# Patient Record
Sex: Female | Born: 1990 | Hispanic: Yes | Marital: Married | Smoking: Former smoker
Health system: Southern US, Community
[De-identification: ages and names within clinical notes are randomized; demographics above are authoritative.]

## PROBLEM LIST (undated history)

## (undated) DIAGNOSIS — Z8759 Personal history of other complications of pregnancy, childbirth and the puerperium: Secondary | ICD-10-CM

## (undated) DIAGNOSIS — D649 Anemia, unspecified: Secondary | ICD-10-CM

## (undated) DIAGNOSIS — F329 Major depressive disorder, single episode, unspecified: Secondary | ICD-10-CM

## (undated) DIAGNOSIS — Z8709 Personal history of other diseases of the respiratory system: Secondary | ICD-10-CM

## (undated) DIAGNOSIS — L309 Dermatitis, unspecified: Secondary | ICD-10-CM

## (undated) DIAGNOSIS — Z8489 Family history of other specified conditions: Secondary | ICD-10-CM

## (undated) DIAGNOSIS — F32A Depression, unspecified: Secondary | ICD-10-CM

## (undated) HISTORY — DX: Depression, unspecified: F32.A

## (undated) HISTORY — PX: TONSILLECTOMY: SUR1361

## (undated) HISTORY — PX: DILATION AND CURETTAGE OF UTERUS: SHX78

## (undated) HISTORY — DX: Personal history of other complications of pregnancy, childbirth and the puerperium: Z87.59

## (undated) HISTORY — DX: Personal history of other diseases of the respiratory system: Z87.09

## (undated) HISTORY — PX: TONSILLECTOMY AND ADENOIDECTOMY: SUR1326

## (undated) HISTORY — DX: Dermatitis, unspecified: L30.9

---
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
## 2009-02-07 ENCOUNTER — Ambulatory Visit: Payer: Self-pay | Admitting: Family Medicine

## 2009-06-13 ENCOUNTER — Ambulatory Visit: Payer: Self-pay | Admitting: Internal Medicine

## 2019-12-28 ENCOUNTER — Telehealth: Payer: Self-pay

## 2019-12-28 NOTE — Telephone Encounter (Signed)
Pt called report heavy bleeding not sure if shes having a miscarriage, I advised to go to the hospital, and we have her scheduled for an annual this Friday with [facility]

## 2019-12-31 ENCOUNTER — Encounter: Payer: Self-pay | Admitting: Obstetrics & Gynecology

## 2019-12-31 ENCOUNTER — Other Ambulatory Visit: Payer: Self-pay

## 2019-12-31 ENCOUNTER — Other Ambulatory Visit (HOSPITAL_COMMUNITY)
Admission: RE | Admit: 2019-12-31 | Discharge: 2019-12-31 | Disposition: A | Discharge status: Home / Self Care | Payer: Self-pay | Source: Ambulatory Visit | Attending: Obstetrics & Gynecology | Admitting: Obstetrics & Gynecology

## 2019-12-31 ENCOUNTER — Ambulatory Visit (INDEPENDENT_AMBULATORY_CARE_PROVIDER_SITE_OTHER): Payer: PRIVATE HEALTH INSURANCE | Admitting: Obstetrics & Gynecology

## 2019-12-31 VITALS — BP 120/80 | Ht 63.0 in | Wt 224.0 lb

## 2019-12-31 DIAGNOSIS — N920 Excessive and frequent menstruation with regular cycle: Secondary | ICD-10-CM

## 2019-12-31 DIAGNOSIS — Z124 Encounter for screening for malignant neoplasm of cervix: Secondary | ICD-10-CM | POA: Insufficient documentation

## 2019-12-31 DIAGNOSIS — Z01419 Encounter for gynecological examination (general) (routine) without abnormal findings: Secondary | ICD-10-CM | POA: Diagnosis not present

## 2019-12-31 DIAGNOSIS — Z6839 Body mass index (BMI) 39.0-39.9, adult: Secondary | ICD-10-CM

## 2019-12-31 DIAGNOSIS — N938 Other specified abnormal uterine and vaginal bleeding: Secondary | ICD-10-CM

## 2019-12-31 DIAGNOSIS — E669 Obesity, unspecified: Secondary | ICD-10-CM

## 2019-12-31 NOTE — Progress Notes (Addendum)
HPI:      [name] is a 29 y.o. G2P1011 who LMP was Patient's last menstrual period was 12/22/2019., she presents today for her annual examination. The patient has no complaints today other than recent menorrhagia as described below. The patient is sexually active. Her last pap: was normal. The patient does perform self breast exams.  There is no notable family history of breast or ovarian cancer in her family.  The patient has regular exercise: yes.  The patient denies current symptoms of depression.  Pt has a h/o rare periods, more so spotting certain months regularly, and an occas full period every 6-74mos.  She started her period last week and it has been heavier than usual and prolonged.  She was seen in ER w neg uCG, and neg vWd testing (has FH).  Placed on short OCP taper. (already was on her Junel of many years.)  Bleeding has since slowed down as of today.  Mild pain associated w menses.    GYN History: Contraception: OCP (estrogen/progesterone)  PMHx: Past Medical History:  Diagnosis Date  . Depression    Past Surgical History:  Procedure Laterality Date  . TONSILLECTOMY     Family History  Problem Relation Age of Onset  . Melanoma Mother 46   Social History   Tobacco Use  . Smoking status: Never Smoker  . Smokeless tobacco: Never Used  Substance Use Topics  . Alcohol use: Yes  . Drug use: Never    Current Outpatient Medications:  .  Biotin 10 MG CAPS, Take by mouth., Disp: , Rfl:  .  busPIRone (BUSPAR) 5 MG tablet, Take 5 mg by mouth 2 (two) times daily., Disp: , Rfl:  .  cyanocobalamin 1000 MCG tablet, Take by mouth., Disp: , Rfl:  .  escitalopram (LEXAPRO) 5 MG tablet, Take by mouth., Disp: , Rfl:  .  fluocinonide cream (LIDEX) 0.05 %, Apply 1 application topically 2 (two) times daily., Disp: , Rfl:  .  ketoconazole (NIZORAL) 2 % shampoo, Apply 1-3 times a week to scalp. Let sit 10 min then rinse out., Disp: , Rfl:  .  Norethindrone Acetate-Ethinyl  Estradiol (LOESTRIN) 1.5-30 MG-MCG tablet, TAKE 1 TABLET BY MOUTH EVERY DAY FOR 21 DAYS, NO PILLS FOR 7 DAYS THEN START NEW PACK AS DIRECTED, Disp: , Rfl:  Allergies: Patient has no allergy information on record.  Review of Systems  Constitutional: Positive for malaise/fatigue. Negative for chills and fever.  HENT: Negative for congestion, sinus pain and sore throat.   Eyes: Negative for blurred vision and pain.  Respiratory: Negative for cough and wheezing.   Cardiovascular: Negative for chest pain and leg swelling.  Gastrointestinal: Positive for abdominal pain. Negative for constipation, diarrhea, heartburn, nausea and vomiting.  Genitourinary: Negative for dysuria, frequency, hematuria and urgency.  Musculoskeletal: Negative for back pain, joint pain, myalgias and neck pain.  Skin: Negative for itching and rash.  Neurological: Negative for dizziness, tremors and weakness.  Endo/Heme/Allergies: Does not bruise/bleed easily.  Psychiatric/Behavioral: Positive for depression. The patient is not nervous/anxious and does not have insomnia.   All other systems reviewed and are negative.   Objective: BP 120/80   Ht 5\' 3"  (1.6 m)   Wt 224 lb (101.6 kg)   LMP 12/22/2019   BMI 39.68 kg/m   Filed Weights   12/31/19 1034  Weight: 224 lb (101.6 kg)   Body mass index is 39.68 kg/m. Physical Exam Constitutional:      General: She is not  in acute distress.    Appearance: She is well-developed. She is obese.  Genitourinary:     Pelvic exam was performed with patient supine.     Vagina, uterus and rectum normal.     No lesions in the vagina.     No vaginal bleeding.     No cervical motion tenderness, friability, lesion or polyp.     Uterus is mobile.     Uterus is not enlarged.     No uterine mass detected.    Uterus is midaxial.     No right or left adnexal mass present.     Right adnexa not tender.     Left adnexa not tender.  HENT:     Head: Normocephalic and atraumatic. No  laceration.     Right Ear: Hearing normal.     Left Ear: Hearing normal.     Mouth/Throat:     Pharynx: Uvula midline.  Eyes:     Pupils: Pupils are equal, round, and reactive to light.  Neck:     Thyroid: No thyromegaly.  Cardiovascular:     Rate and Rhythm: Normal rate and regular rhythm.     Heart sounds: No murmur. No friction rub. No gallop.   Pulmonary:     Effort: Pulmonary effort is normal. No respiratory distress.     Breath sounds: Normal breath sounds. No wheezing.  Chest:     Breasts:        Right: No mass, skin change or tenderness.        Left: No mass, skin change or tenderness.  Abdominal:     General: Bowel sounds are normal. There is no distension.     Palpations: Abdomen is soft.     Tenderness: There is no abdominal tenderness. There is no rebound.  Musculoskeletal:        General: Normal range of motion.     Cervical back: Normal range of motion and neck supple.  Neurological:     Mental Status: She is alert and oriented to person, place, and time.     Cranial Nerves: No cranial nerve deficit.  Skin:    General: Skin is warm and dry.  Psychiatric:        Judgment: Judgment normal.  Vitals reviewed.     Assessment:  ANNUAL EXAM 1. Women's annual routine gynecological examination   2. Menorrhagia with regular cycle   3. Screening for cervical cancer      Screening Plan:            1.  Cervical Screening-  Pap smear done today  2. Breast screening- Exam annually and mammogram>40 planned   3. Colonoscopy every 10 years, Hemoccult testing - after age 53  4. Labs managed by PCP  5. Counseling for contraception: oral contraceptives (estrogen/progesterone)   6. Menorrhagia with regular cycle Patient has abnormal uterine bleeding . She has a normal exam today, with no evidence of lesions.  Evaluation includes the following: exam, labs such as hormonal testing, and pelvic ultrasound to evaluate for any structural gynecologic abnormalities.  Patient  to follow up after testing.  Treatment option for menorrhagia or menometrorrhagia discussed in great detail with the patient.  Options include hormonal therapy, IUD therapy such as Mirena, D&C, Ablation, and Hysterectomy.  The pros and cons of each option discussed with patient.  - US PELVIC COMPLETE WITH TRANSVAGINAL; Future  An additional total of 20 minutes were spent face-to-face with the patient as well as preparation, review, communication,  and documentation during this encounter.     F/U  Return in about 1 week (around 01/07/2020) for Follow up w GYN [place].  Barnett Applebaum, MD, [facility], [facility] 12/31/2019  11:12 AM

## 2019-12-31 NOTE — Patient Instructions (Signed)
Transvaginal Ultrasound A transvaginal ultrasound, also called an endovaginal ultrasound, is a test that uses sound waves to take pictures of the female genital tract. The pictures are taken with a device, called a transducer, that is placed in the vagina. This test may be done to:  Check for problems with your pregnancy.  Check your developing baby.  Check for anything abnormal in the uterus or ovaries.  Find out why you have pelvic pain or bleeding. Tell a health care provider about:  Any allergies you have.  All medicines you are taking, including vitamins, herbs, eye drops, creams, and over-the-counter medicines.  Any blood disorders you have.  Any surgeries you have had.  Any medical conditions you have.  Whether you are pregnant or may be pregnant.  Whether you are having your menstrual period. What are the risks? This is a safe procedure. There are no known risks or complications of having this test. What happens before the procedure? This procedure needs to be done when your bladder is empty. Follow your health care provider's instructions about drinking fluids and emptying your bladder before the test. What happens during the procedure?   You will empty your bladder before the procedure.  You will undress from the waist down.  You will lie down on an exam table, with your knees bent and your feet in foot holders.  A health care provider will cover the transducer with a sterile cover.  A gel will be put on the transducer. The gel helps transmit the sound waves and prevents irritation of your vagina.  The technician will insert the transducer into your vagina to get images. These will be displayed on a monitor that looks like a small television screen.  The transducer will be removed when the procedure is complete. The procedure may vary among health care providers and hospitals. What happens after the procedure?  It is up to you to get the results of your  procedure. Ask your health care provider, or the department that is doing the procedure, when your results will be ready.  Keep all follow-up visits as told by your health care provider. This is important. Summary  A transvaginal ultrasound, also called an endovaginal ultrasound, is a test that uses sound waves to take pictures of the female genital tract.  This is a safe procedure. There are no known risks associated with this test.  The procedure needs to be done when your bladder is empty. Follow your health care provider's instructions about drinking fluids and emptying your bladder before the test.  During the procedure, you will undress from the waist down and lie down on an exam table. A technician will insert a transducer into your vagina to obtain images.  Ask your health care provider, or the department that is doing the procedure, when your results will be ready. This information is not intended to replace advice given to you by your health care provider. Make sure you discuss any questions you have with your health care provider. Document Revised: 05/20/2018 Document Reviewed: 05/20/2018 Elsevier Patient Education  2020 Elsevier Inc.  

## 2020-01-03 LAB — CYTOLOGY - PAP: Diagnosis: NEGATIVE

## 2020-01-10 ENCOUNTER — Ambulatory Visit (INDEPENDENT_AMBULATORY_CARE_PROVIDER_SITE_OTHER): Payer: PRIVATE HEALTH INSURANCE | Admitting: Obstetrics & Gynecology

## 2020-01-10 ENCOUNTER — Other Ambulatory Visit: Payer: Self-pay

## 2020-01-10 ENCOUNTER — Ambulatory Visit (INDEPENDENT_AMBULATORY_CARE_PROVIDER_SITE_OTHER): Payer: PRIVATE HEALTH INSURANCE

## 2020-01-10 ENCOUNTER — Other Ambulatory Visit: Payer: Self-pay | Admitting: Obstetrics & Gynecology

## 2020-01-10 ENCOUNTER — Encounter: Payer: Self-pay | Admitting: Obstetrics & Gynecology

## 2020-01-10 VITALS — BP 120/80 | Ht 63.0 in | Wt 228.0 lb

## 2020-01-10 DIAGNOSIS — N920 Excessive and frequent menstruation with regular cycle: Secondary | ICD-10-CM | POA: Diagnosis not present

## 2020-01-10 DIAGNOSIS — N921 Excessive and frequent menstruation with irregular cycle: Secondary | ICD-10-CM | POA: Diagnosis not present

## 2020-01-10 NOTE — Progress Notes (Signed)
  HPI: Pt has had prolonged bleeding for this month, on OCPs for three years without prior incident.    Ultrasound demonstrates no masses seen These findings are Pelvis normal  PMHx: She  has a past medical history of Depression. Also,  has a past surgical history that includes Tonsillectomy., family history includes Melanoma (age of onset: 11) in her mother.,  reports that she has never smoked. She has never used smokeless tobacco. She reports current alcohol use. She reports that she does not use drugs.  She has a current medication list which includes the following prescription(s): biotin, buspirone, cyanocobalamin, escitalopram, fluocinonide cream, ketoconazole, and norethindrone acetate-ethinyl estradiol. Also, has no allergies on file.  Review of Systems  All other systems reviewed and are negative.   Objective: BP 120/80   Ht 5\' 3"  (1.6 m)   Wt 228 lb (103.4 kg)   LMP 12/22/2019   BMI 40.39 kg/m   Physical examination Constitutional NAD, Conversant  Skin No rashes, lesions or ulceration.   Extremities: Moves all appropriately.  Normal ROM for age. No lymphadenopathy.  Neuro: Grossly intact  Psych: Oriented to PPT.  Normal mood. Normal affect.   02/21/2020 PELVIS TRANSVAGINAL NON-OB (TV ONLY)  Result Date: 01/10/2020 Patient Name: Meagan Graham DOB: 12/18/1990 MRN: 1991 ULTRASOUND REPORT Location: [facility] Date of Service: 01/10/2020 Indications:Abnormal Uterine Bleeding Findings: The uterus is anteverted and measures 7.8 x 4.9 x 3.9 cm. Echo texture is homogenous without evidence of focal masses. The Endometrium measures 4.4 mm. Right Ovary measures 2.9 x 3.0 x 2.6 cm. It is normal in appearance. Left Ovary measures 3.7 x 2.9 x 2.9 cm. It is normal in appearance. Survey of the adnexa demonstrates no adnexal masses. There is no free fluid in the cul de sac. Impression: 1. Normal pelvic ultrasound. Recommendations: 1.Clinical correlation with the patient's History and  Physical Exam. 01/12/2020, RT Review of ULTRASOUND.    I have personally reviewed images and report of recent ultrasound done at [facility].    Plan of management to be discussed with patient. [facility], MD, FACOG [facility], [facility] 01/10/2020  3:37 PM   Assessment:  Menometrorrhagia OCP new pack start now Alternatives for hormone control of cycles and bleeding discussed  A total of 20 minutes were spent face-to-face with the patient as well as preparation, review, communication, and documentation during this encounter.   01/12/2020, MD, [facility], [facility] 01/10/2020  3:49 PM

## 2020-01-12 ENCOUNTER — Telehealth: Payer: Self-pay

## 2020-01-12 NOTE — Telephone Encounter (Signed)
Pt called triage to let [facility] know the bleeding is back and is heavy as before started last night around 10pm, no pain or cramps  Just wanting to know what to do next. Pt aware RPH will be in the office tomorrow, I advised her to go to the ER if changing pad every hour.

## 2020-01-13 NOTE — Telephone Encounter (Signed)
Pt aware to start the pills,you are talking about her BC pills correct? Also wanted to ask if her being on her feet at work 12 hours a day would cause this bleeding. She also states she's had this job for 3 years, I told her I didn't think it had anything to do with her work, but would still ask you.

## 2020-01-13 NOTE — Telephone Encounter (Signed)
Advise her to take 3 pills daily for 3 days, then 2 pills daily for 3 days, then one pill daily until pack finished then start next pack immediately as directed.  Only other option at this point would be surgery if the bleeding does not improve.

## 2020-01-13 NOTE — Telephone Encounter (Signed)
Pt reports bleeding is a lot more, and from 8AM to 12:30 today she's changed her pad 8 time, She wanted to know of the pills would make it worse before it got better. Please advise

## 2020-01-14 ENCOUNTER — Telehealth: Payer: Self-pay | Admitting: Obstetrics & Gynecology

## 2020-01-14 ENCOUNTER — Other Ambulatory Visit: Payer: Self-pay

## 2020-01-14 ENCOUNTER — Encounter: Payer: Self-pay | Admitting: Obstetrics & Gynecology

## 2020-01-14 ENCOUNTER — Ambulatory Visit (INDEPENDENT_AMBULATORY_CARE_PROVIDER_SITE_OTHER): Payer: PRIVATE HEALTH INSURANCE | Admitting: Obstetrics & Gynecology

## 2020-01-14 VITALS — BP 100/70 | Ht 63.0 in | Wt 225.0 lb

## 2020-01-14 DIAGNOSIS — N938 Other specified abnormal uterine and vaginal bleeding: Secondary | ICD-10-CM | POA: Diagnosis not present

## 2020-01-14 DIAGNOSIS — N921 Excessive and frequent menstruation with irregular cycle: Secondary | ICD-10-CM

## 2020-01-14 NOTE — Patient Instructions (Addendum)
PRE ADMISSION TESTING For Covid, prior to procedure Tuesday 9:00-10:00 [facility] entrance (drive up)  Results in 88-91 hours You will not receive notification if test results are negative. If positive for Covid19, your provider will notify you by phone, with additional instructions.   Hysteroscopy, Care After This sheet gives you information about how to care for yourself after your procedure. Your health care provider may also give you more specific instructions. If you have problems or questions, contact your health care provider. What can I expect after the procedure? After the procedure, it is common to have:  Cramping.  Bleeding. This can vary from light spotting to menstrual-like bleeding. Follow these instructions at home: Activity  Rest for 1-2 days after the procedure.  Do not douche, use tampons, or have sex for 2 weeks after the procedure, or until your health care provider approves.  Do not drive for 24 hours after the procedure, or for as long as told by your health care provider.  Do not drive, use heavy machinery, or drink alcohol while taking prescription pain medicines. Medicines   Take over-the-counter and prescription medicines only as told by your health care provider.  Do not take aspirin during recovery. It can increase the risk of bleeding. General instructions  Do not take baths, swim, or use a hot tub until your health care provider approves. Take showers instead of baths for 2 weeks, or for as long as told by your health care provider.  To prevent or treat constipation while you are taking prescription pain medicine, your health care provider may recommend that you: ? Drink enough fluid to keep your urine clear or pale yellow. ? Take over-the-counter or prescription medicines. ? Eat foods that are high in fiber, such as fresh fruits and vegetables, whole grains, and beans. ? Limit foods that are high in fat and processed sugars, such as  fried and sweet foods.  Keep all follow-up visits as told by your health care provider. This is important. Contact a health care provider if:  You feel dizzy or lightheaded.  You feel nauseous.  You have abnormal vaginal discharge.  You have a rash.  You have pain that does not get better with medicine.  You have chills. Get help right away if:  You have bleeding that is heavier than a normal menstrual period.  You have a fever.  You have pain or cramps that get worse.  You develop new abdominal pain.  You faint.  You have pain in your shoulders.  You have shortness of breath. Summary  After the procedure, you may have cramping and some vaginal bleeding.  Do not douche, use tampons, or have sex for 2 weeks after the procedure, or until your health care provider approves.  Do not take baths, swim, or use a hot tub until your health care provider approves. Take showers instead of baths for 2 weeks, or for as long as told by your health care provider.  Report any unusual symptoms to your health care provider.  Keep all follow-up visits as told by your health care provider. This is important. This information is not intended to replace advice given to you by your health care provider. Make sure you discuss any questions you have with your health care provider. Document Revised: 09/19/2017 Document Reviewed: 11/05/2016 Elsevier Patient Education  2020 [employer].

## 2020-01-14 NOTE — Telephone Encounter (Signed)
-----   Message from Nadara Mustard, MD sent at 01/14/2020 11:28 AM EDT ----- Regarding: Surgery next week Surgery Booking Request Patient Full Name:  Meagan Graham  MRN: [id number]  DOB: [birth year]  Surgeon: Letitia Libra, MD  Requested Surgery Date and Time: April 1 Primary Diagnosis AND Code: Menometrorrhagia Secondary Diagnosis and Code: N92.1 Surgical Procedure: Hysteroscopy D&C L&D Notification: No Admission Status: same day surgery Length of Surgery: 20 min Special Case Needs: No H&P: No Phone Interview???:  Yes Interpreter: No Language:  Medical Clearance:  No Special Scheduling Instructions: no Any known health/anesthesia issues, diabetes, sleep apnea, latex allergy, defibrillator/pacemaker?: No Acuity: P1   (P1 highest, P2 delay may cause harm, P3 low, elective gyn, P4 lowest)

## 2020-01-14 NOTE — Telephone Encounter (Signed)
Patient is schedule for 01/14/20 with RPH at 11 am. Patient is aware

## 2020-01-14 NOTE — Progress Notes (Signed)
   PRE-OPERATIVE HISTORY AND PHYSICAL EXAM  HPI:  Meagan Graham is a 29 y.o. G2P1011 Patient's last menstrual period was 12/22/2019.; she is being admitted for surgery related to abnormal uterine bleeding.   She continues to have heavy vaginal bleeding despite Provera therapy and high dose OCP therapy.  Anemia has developed as well.  PMHx: Past Medical History:  Diagnosis Date  . Depression    Past Surgical History:  Procedure Laterality Date  . TONSILLECTOMY     Family History  Problem Relation Age of Onset  . Melanoma Mother 20   Social History   Tobacco Use  . Smoking status: Never Smoker  . Smokeless tobacco: Never Used  Substance Use Topics  . Alcohol use: Yes  . Drug use: Never    Current Outpatient Medications:  .  Biotin 10 MG CAPS, Take by mouth., Disp: , Rfl:  .  busPIRone (BUSPAR) 5 MG tablet, Take 5 mg by mouth 2 (two) times daily., Disp: , Rfl:  .  cyanocobalamin 1000 MCG tablet, Take by mouth., Disp: , Rfl:  .  escitalopram (LEXAPRO) 5 MG tablet, Take by mouth., Disp: , Rfl:  .  fluocinonide cream (LIDEX) 0.05 %, Apply 1 application topically 2 (two) times daily., Disp: , Rfl:  .  ketoconazole (NIZORAL) 2 % shampoo, Apply 1-3 times a week to scalp. Let sit 10 min then rinse out., Disp: , Rfl:  .  Norethindrone Acetate-Ethinyl Estradiol (LOESTRIN) 1.5-30 MG-MCG tablet, TAKE 1 TABLET BY MOUTH EVERY DAY FOR 21 DAYS, NO PILLS FOR 7 DAYS THEN START NEW PACK AS DIRECTED, Disp: , Rfl:  Allergies: Patient has no allergy information on record.  Review of Systems  Constitutional: Positive for malaise/fatigue. Negative for chills and fever.  HENT: Negative for congestion, sinus pain and sore throat.   Eyes: Negative for blurred vision and pain.  Respiratory: Negative for cough and wheezing.   Cardiovascular: Negative for chest pain and leg swelling.  Gastrointestinal: Positive for abdominal pain. Negative for constipation, diarrhea, heartburn, nausea and vomiting.   Genitourinary: Negative for dysuria, frequency, hematuria and urgency.  Musculoskeletal: Negative for back pain, joint pain, myalgias and neck pain.  Skin: Negative for itching and rash.  Neurological: Negative for dizziness, tremors and weakness.  Endo/Heme/Allergies: Does not bruise/bleed easily.  Psychiatric/Behavioral: Negative for depression. The patient is not nervous/anxious and does not have insomnia.     Objective: BP 100/70   Ht 5' 3" (1.6 m)   Wt 225 lb (102.1 kg)   LMP 12/22/2019   BMI 39.86 kg/m   Filed Weights   01/14/20 1107  Weight: 225 lb (102.1 kg)   Physical Exam Constitutional:      General: She is not in acute distress.    Appearance: She is well-developed.  HENT:     Head: Normocephalic and atraumatic. No laceration.     Right Ear: Hearing normal.     Left Ear: Hearing normal.     Mouth/Throat:     Pharynx: Uvula midline.  Eyes:     Pupils: Pupils are equal, round, and reactive to light.  Neck:     Thyroid: No thyromegaly.  Cardiovascular:     Rate and Rhythm: Normal rate and regular rhythm.     Heart sounds: No murmur. No friction rub. No gallop.   Pulmonary:     Effort: Pulmonary effort is normal. No respiratory distress.     Breath sounds: Normal breath sounds. No wheezing.  Chest:     Breasts:          Right: No mass, skin change or tenderness.        Left: No mass, skin change or tenderness.  Abdominal:     General: Bowel sounds are normal. There is no distension.     Palpations: Abdomen is soft.     Tenderness: There is no abdominal tenderness. There is no rebound.  Musculoskeletal:        General: Normal range of motion.     Cervical back: Normal range of motion and neck supple.  Neurological:     Mental Status: She is alert and oriented to person, place, and time.     Cranial Nerves: No cranial nerve deficit.  Skin:    General: Skin is warm and dry.  Psychiatric:        Judgment: Judgment normal.  Vitals reviewed.   LABS- Hgb  9.3 yesterday at [facility] ER PAP normal 2 weeks ago US normal 5 days ago  Assessment: 1. Menometrorrhagia   Failed medical and hormonal therapy Option for Hysteroscopy D&C discussed.  I have had a careful discussion with this patient about all the options available and the risk/benefits of each. I have fully informed this patient that surgery may subject her to a variety of discomforts and risks: She understands that most patients have surgery with little difficulty, but problems can happen ranging from minor to fatal. These include nausea, vomiting, pain, bleeding, infection, poor healing, hernia, or formation of adhesions. Unexpected reactions may occur from any drug or anesthetic given. Unintended injury may occur to other pelvic or abdominal structures such as Fallopian tubes, ovaries, bladder, ureter (tube from kidney to bladder), or bowel. Nerves going from the pelvis to the legs may be injured. Any such injury may require immediate or later additional surgery to correct the problem. Excessive blood loss requiring transfusion is very unlikely but possible. Dangerous blood clots may form in the legs or lungs. Physical and sexual activity will be restricted in varying degrees for an indeterminate period of time but most often 2-6 weeks.  Finally, she understands that it is impossible to list every possible undesirable effect and that the condition for which surgery is done is not always cured or significantly improved, and in rare cases may be even worse.Ample time was given to answer all questions.  Paul Einar Nolasco, MD, FACOG [facility], [facility] 01/14/2020  11:38 AM  

## 2020-01-14 NOTE — Telephone Encounter (Signed)
-----   Message from Nadara Mustard, MD sent at 01/14/2020  7:50 AM EDT ----- Regarding: Work in patient today 1100, if she can Let her know I will see her today, with possibility of plan for procedure or surgery planning due to continued bleeding concerns

## 2020-01-14 NOTE — Telephone Encounter (Signed)
Tried to call pt to adv of DOS details. Voice mail is full. Will try back.  DOS 4/1  Preadmit phone 3/29 @ 8-1  Covid 3/30 8-10:30am

## 2020-01-14 NOTE — H&P (View-Only) (Signed)
PRE-OPERATIVE HISTORY AND PHYSICAL EXAM  HPI:  Meagan Graham is a 29 y.o. G2P1011 Patient's last menstrual period was 12/22/2019.; she is being admitted for surgery related to abnormal uterine bleeding.   She continues to have heavy vaginal bleeding despite Provera therapy and high dose OCP therapy.  Anemia has developed as well.  PMHx: Past Medical History:  Diagnosis Date  . Depression    Past Surgical History:  Procedure Laterality Date  . TONSILLECTOMY     Family History  Problem Relation Age of Onset  . Melanoma Mother 11   Social History   Tobacco Use  . Smoking status: Never Smoker  . Smokeless tobacco: Never Used  Substance Use Topics  . Alcohol use: Yes  . Drug use: Never    Current Outpatient Medications:  .  Biotin 10 MG CAPS, Take by mouth., Disp: , Rfl:  .  busPIRone (BUSPAR) 5 MG tablet, Take 5 mg by mouth 2 (two) times daily., Disp: , Rfl:  .  cyanocobalamin 1000 MCG tablet, Take by mouth., Disp: , Rfl:  .  escitalopram (LEXAPRO) 5 MG tablet, Take by mouth., Disp: , Rfl:  .  fluocinonide cream (LIDEX) 9.74 %, Apply 1 application topically 2 (two) times daily., Disp: , Rfl:  .  ketoconazole (NIZORAL) 2 % shampoo, Apply 1-3 times a week to scalp. Let sit 10 min then rinse out., Disp: , Rfl:  .  Norethindrone Acetate-Ethinyl Estradiol (LOESTRIN) 1.5-30 MG-MCG tablet, TAKE 1 TABLET BY MOUTH EVERY DAY FOR 21 DAYS, NO PILLS FOR 7 DAYS THEN START NEW PACK AS DIRECTED, Disp: , Rfl:  Allergies: Patient has no allergy information on record.  Review of Systems  Constitutional: Positive for malaise/fatigue. Negative for chills and fever.  HENT: Negative for congestion, sinus pain and sore throat.   Eyes: Negative for blurred vision and pain.  Respiratory: Negative for cough and wheezing.   Cardiovascular: Negative for chest pain and leg swelling.  Gastrointestinal: Positive for abdominal pain. Negative for constipation, diarrhea, heartburn, nausea and vomiting.   Genitourinary: Negative for dysuria, frequency, hematuria and urgency.  Musculoskeletal: Negative for back pain, joint pain, myalgias and neck pain.  Skin: Negative for itching and rash.  Neurological: Negative for dizziness, tremors and weakness.  Endo/Heme/Allergies: Does not bruise/bleed easily.  Psychiatric/Behavioral: Negative for depression. The patient is not nervous/anxious and does not have insomnia.     Objective: BP 100/70   Ht 5\' 3"  (1.6 m)   Wt 225 lb (102.1 kg)   LMP 12/22/2019   BMI 39.86 kg/m   Filed Weights   01/14/20 1107  Weight: 225 lb (102.1 kg)   Physical Exam Constitutional:      General: She is not in acute distress.    Appearance: She is well-developed.  HENT:     Head: Normocephalic and atraumatic. No laceration.     Right Ear: Hearing normal.     Left Ear: Hearing normal.     Mouth/Throat:     Pharynx: Uvula midline.  Eyes:     Pupils: Pupils are equal, round, and reactive to light.  Neck:     Thyroid: No thyromegaly.  Cardiovascular:     Rate and Rhythm: Normal rate and regular rhythm.     Heart sounds: No murmur. No friction rub. No gallop.   Pulmonary:     Effort: Pulmonary effort is normal. No respiratory distress.     Breath sounds: Normal breath sounds. No wheezing.  Chest:     Breasts:  Right: No mass, skin change or tenderness.        Left: No mass, skin change or tenderness.  Abdominal:     General: Bowel sounds are normal. There is no distension.     Palpations: Abdomen is soft.     Tenderness: There is no abdominal tenderness. There is no rebound.  Musculoskeletal:        General: Normal range of motion.     Cervical back: Normal range of motion and neck supple.  Neurological:     Mental Status: She is alert and oriented to person, place, and time.     Cranial Nerves: No cranial nerve deficit.  Skin:    General: Skin is warm and dry.  Psychiatric:        Judgment: Judgment normal.  Vitals reviewed.   LABS- Hgb  9.3 yesterday at [facility] PAP normal 2 weeks ago [place] normal 5 days ago  Assessment: 1. Menometrorrhagia   Failed medical and hormonal therapy Option for Hysteroscopy D&C discussed.  I have had a careful discussion with this patient about all the options available and the risk/benefits of each. I have fully informed this patient that surgery may subject her to a variety of discomforts and risks: She understands that most patients have surgery with little difficulty, but problems can happen ranging from minor to fatal. These include nausea, vomiting, pain, bleeding, infection, poor healing, hernia, or formation of adhesions. Unexpected reactions may occur from any drug or anesthetic given. Unintended injury may occur to other pelvic or abdominal structures such as Fallopian tubes, ovaries, bladder, ureter (tube from kidney to bladder), or bowel. Nerves going from the pelvis to the legs may be injured. Any such injury may require immediate or later additional surgery to correct the problem. Excessive blood loss requiring transfusion is very unlikely but possible. Dangerous blood clots may form in the legs or lungs. Physical and sexual activity will be restricted in varying degrees for an indeterminate period of time but most often 2-6 weeks.  Finally, she understands that it is impossible to list every possible undesirable effect and that the condition for which surgery is done is not always cured or significantly improved, and in rare cases may be even worse.Ample time was given to answer all questions.  Annamarie Major, MD, [facility], [facility] 01/14/2020  11:38 AM

## 2020-01-17 ENCOUNTER — Telehealth: Payer: Self-pay

## 2020-01-17 ENCOUNTER — Other Ambulatory Visit
Admission: RE | Admit: 2020-01-17 | Discharge: 2020-01-17 | Disposition: A | Discharge status: Home / Self Care | Payer: PRIVATE HEALTH INSURANCE | Source: Ambulatory Visit | Attending: Obstetrics & Gynecology | Admitting: Obstetrics & Gynecology

## 2020-01-17 ENCOUNTER — Other Ambulatory Visit: Payer: Self-pay

## 2020-01-17 DIAGNOSIS — Z01812 Encounter for preprocedural laboratory examination: Secondary | ICD-10-CM | POA: Insufficient documentation

## 2020-01-17 DIAGNOSIS — Z20822 Contact with and (suspected) exposure to covid-19: Secondary | ICD-10-CM | POA: Insufficient documentation

## 2020-01-17 HISTORY — DX: Family history of other specified conditions: Z84.89

## 2020-01-17 HISTORY — DX: Anemia, unspecified: D64.9

## 2020-01-17 NOTE — Patient Instructions (Addendum)
Your procedure is scheduled on: Thursday January 20, 2020 Report to Day Surgery. To find out your arrival time please call [phone] between 1PM - 3PM on Wednesday January 19, 2020.  Remember: Instructions that are not followed completely may result in serious medical risk,  up to and including death, or upon the discretion of your surgeon and anesthesiologist your  surgery may need to be rescheduled.     _X__ 1. Do not eat food after midnight the night before your procedure.                 No gum chewing or hard candies. You may drink clear liquids up to 2 hours                 before you are scheduled to arrive for your surgery- DO not drink clear                 liquids within 2 hours of the start of your surgery.                 Clear Liquids include:  water, apple juice without pulp, clear Gatorade, G2 or                  Gatorade Zero (avoid Red/Purple/Blue), Black Coffee or Tea (Do not add                 anything to coffee or tea).  __X__2.  On the morning of surgery brush your teeth with toothpaste and water, you                may rinse your mouth with mouthwash if you wish.  Do not swallow any toothpaste of mouthwash.     _X__ 3.  No Alcohol for 24 hours before or after surgery.   _X__ 4.  Do Not Smoke or use e-cigarettes For 24 Hours Prior to Your Surgery.                 Do not use any chewable tobacco products for at least 6 hours prior to                 surgery.  __x__  6.  Notify your doctor if there is any change in your medical condition      (cold, fever, infections).     Do not wear jewelry, make-up, hairpins, clips or nail polish. Do not wear lotions, powders, or perfumes. You may wear deodorant. Do not shave 48 hours prior to surgery. Men may shave face and neck. Do not bring valuables to the hospital.    [facility] is not responsible for any belongings or valuables.  Contacts, dentures or bridgework may not be worn into  surgery. Leave your suitcase in the car. After surgery it may be brought to your room. For patients admitted to the hospital, discharge time is determined by your treatment team.   Patients discharged the day of surgery will not be allowed to drive home.   Make arrangements for someone to be with you for the first 24 hours of your Same Day Discharge.   __x__ Take these medicines the morning of surgery with A SIP OF WATER:    1. escitalopram (LEXAPRO) 5 MG   2. busPIRone (BUSPAR) 5 MG  3. Norethindrone Acetate-Ethinyl Estradiol (LOESTRIN)   __x__ Stop Anti-inflammatories such as ibuprofen, Aleve, Advil, naproxen, aspirin and or BC powders.    __x__ Stop supplements until after surgery.  __x__ Do not start any herbal supplements before your surgery.   Covid Quarantine after your Covid Test -Stay at home from work, school and away from other public places. - The exception to the "stay at home rule" is leaving to get medical care. - As much as possible stay in a specific room and away from other people in your home. - Avoid sharing personal items with other people in your household, like dishes, towels and bedding.  - Clean all surfaces that are touched often, like counters, tabletops and or doorknobs. - Wash your hands often with soap and wayer for at least 20 seconds or clean your hands with an alcohol based hand sanitizer that contains at least 60% alcohol. - Cover your cough and sneezes.  - If you develop a fever of 100.4 or greater, sneezing, cough, sore throat, shortness of breath or body aches, call your health care provider.

## 2020-01-17 NOTE — Telephone Encounter (Signed)
Pt calling to let us know she is going to go through with the surgery, still having the heavy bleeding.

## 2020-01-18 ENCOUNTER — Other Ambulatory Visit
Admission: RE | Admit: 2020-01-18 | Discharge: 2020-01-18 | Disposition: A | Discharge status: Home / Self Care | Payer: PRIVATE HEALTH INSURANCE | Source: Ambulatory Visit | Attending: Obstetrics & Gynecology | Admitting: Obstetrics & Gynecology

## 2020-01-18 ENCOUNTER — Other Ambulatory Visit: Payer: Self-pay | Admitting: Obstetrics & Gynecology

## 2020-01-18 DIAGNOSIS — Z20822 Contact with and (suspected) exposure to covid-19: Secondary | ICD-10-CM | POA: Diagnosis not present

## 2020-01-18 DIAGNOSIS — Z01812 Encounter for preprocedural laboratory examination: Secondary | ICD-10-CM | POA: Diagnosis not present

## 2020-01-18 LAB — CBC
HCT: 28.1 % — ABNORMAL LOW (ref 36.0–46.0)
Hemoglobin: 8.5 g/dL — ABNORMAL LOW (ref 12.0–15.0)
MCH: 27.4 pg (ref 26.0–34.0)
MCHC: 30.2 g/dL (ref 30.0–36.0)
MCV: 90.6 fL (ref 80.0–100.0)
Platelets: 234 10*3/uL (ref 150–400)
RBC: 3.1 MIL/uL — ABNORMAL LOW (ref 3.87–5.11)
RDW: 14.9 % (ref 11.5–15.5)
WBC: 5.4 10*3/uL (ref 4.0–10.5)
nRBC: 0 % (ref 0.0–0.2)

## 2020-01-18 LAB — TYPE AND SCREEN
ABO/RH(D): O POS
Antibody Screen: NEGATIVE

## 2020-01-18 LAB — SARS CORONAVIRUS 2 (TAT 6-24 HRS): SARS Coronavirus 2: NEGATIVE

## 2020-01-18 NOTE — Telephone Encounter (Signed)
DOS 4/1 w Dr Despina Hick pt to adv that she needs to get Covid testing done today before 10:30. She has already recd numerous calls from pre-admit, pharmacy, etc. yesterday.

## 2020-01-18 NOTE — Pre-Procedure Instructions (Signed)
Pre-Admit Testing Provider Notification Note  Provider Notified: Dr. Tiburcio Pea  Notification Mode:  1) Fax 2) Secure Chat:  "Good afternoon Dr. Tiburcio Pea. Just an FYI, I faxed CBC results to your office today along with results from [facility] for comparison from 3/22. Have a great rest of your day! - Jeffre Enriques"   Reason: Abnormal CBC  Response:  1) Fax Confirmation received. 2) Awaiting Secure Chat response.  Additional Information: Placed on chart. Noted on Pre-Admit Worksheet.  Signed: Alvester Morin, RN

## 2020-01-20 ENCOUNTER — Ambulatory Visit
Admission: RE | Admit: 2020-01-20 | Discharge: 2020-01-20 | Disposition: A | Discharge status: Home / Self Care | Payer: PRIVATE HEALTH INSURANCE | Attending: Obstetrics & Gynecology | Admitting: Obstetrics & Gynecology

## 2020-01-20 ENCOUNTER — Ambulatory Visit: Payer: PRIVATE HEALTH INSURANCE | Admitting: Anesthesiology

## 2020-01-20 ENCOUNTER — Encounter
Admission: RE | Disposition: A | Discharge status: Home / Self Care | Payer: Self-pay | Source: Home / Self Care | Attending: Obstetrics & Gynecology

## 2020-01-20 ENCOUNTER — Encounter: Payer: Self-pay | Admitting: Obstetrics & Gynecology

## 2020-01-20 ENCOUNTER — Other Ambulatory Visit: Payer: Self-pay

## 2020-01-20 DIAGNOSIS — N921 Excessive and frequent menstruation with irregular cycle: Secondary | ICD-10-CM | POA: Diagnosis present

## 2020-01-20 DIAGNOSIS — Z793 Long term (current) use of hormonal contraceptives: Secondary | ICD-10-CM | POA: Insufficient documentation

## 2020-01-20 DIAGNOSIS — D500 Iron deficiency anemia secondary to blood loss (chronic): Secondary | ICD-10-CM | POA: Diagnosis not present

## 2020-01-20 DIAGNOSIS — F329 Major depressive disorder, single episode, unspecified: Secondary | ICD-10-CM | POA: Diagnosis not present

## 2020-01-20 DIAGNOSIS — Z79899 Other long term (current) drug therapy: Secondary | ICD-10-CM | POA: Insufficient documentation

## 2020-01-20 HISTORY — PX: HYSTEROSCOPY WITH D & C: SHX1775

## 2020-01-20 LAB — POCT PREGNANCY, URINE: Preg Test, Ur: NEGATIVE

## 2020-01-20 LAB — ABO/RH: ABO/RH(D): O POS

## 2020-01-20 SURGERY — DILATATION AND CURETTAGE /HYSTEROSCOPY
Anesthesia: General

## 2020-01-20 MED ORDER — PROPOFOL 10 MG/ML IV BOLUS
INTRAVENOUS | Status: DC | PRN
  Administered 2020-01-20: 150 mg via INTRAVENOUS

## 2020-01-20 MED ORDER — MIDAZOLAM HCL 2 MG/2ML IJ SOLN
INTRAMUSCULAR | Status: AC
  Filled 2020-01-20: qty 2

## 2020-01-20 MED ORDER — ACETAMINOPHEN 325 MG PO TABS: 650.0000 mg | ORAL_TABLET | ORAL | Status: DC | PRN

## 2020-01-20 MED ORDER — FAMOTIDINE 20 MG PO TABS: 20.0000 mg | ORAL_TABLET | Freq: Once | ORAL | Status: AC

## 2020-01-20 MED ORDER — PROPOFOL 10 MG/ML IV BOLUS
INTRAVENOUS | Status: AC
  Filled 2020-01-20: qty 40

## 2020-01-20 MED ORDER — KETOROLAC TROMETHAMINE 30 MG/ML IJ SOLN
INTRAMUSCULAR | Status: AC
  Filled 2020-01-20: qty 1

## 2020-01-20 MED ORDER — LACTATED RINGERS IV SOLN: INTRAVENOUS | Status: DC

## 2020-01-20 MED ORDER — MORPHINE SULFATE (PF) 4 MG/ML IV SOLN: 1.0000 mg | INTRAVENOUS | Status: DC | PRN

## 2020-01-20 MED ORDER — LIDOCAINE HCL (CARDIAC) PF 100 MG/5ML IV SOSY
PREFILLED_SYRINGE | INTRAVENOUS | Status: DC | PRN
  Administered 2020-01-20: 60 mg via INTRAVENOUS

## 2020-01-20 MED ORDER — KETOROLAC TROMETHAMINE 30 MG/ML IJ SOLN
INTRAMUSCULAR | Status: DC | PRN
  Administered 2020-01-20: 30 mg via INTRAVENOUS

## 2020-01-20 MED ORDER — FAMOTIDINE 20 MG PO TABS
ORAL_TABLET | ORAL | Status: AC
  Administered 2020-01-20: 20 mg via ORAL
  Filled 2020-01-20: qty 1

## 2020-01-20 MED ORDER — DEXAMETHASONE SODIUM PHOSPHATE 10 MG/ML IJ SOLN
INTRAMUSCULAR | Status: DC | PRN
  Administered 2020-01-20: 10 mg via INTRAVENOUS

## 2020-01-20 MED ORDER — MIDAZOLAM HCL 2 MG/2ML IJ SOLN
INTRAMUSCULAR | Status: DC | PRN
  Administered 2020-01-20: 2 mg via INTRAVENOUS

## 2020-01-20 MED ORDER — FENTANYL CITRATE (PF) 100 MCG/2ML IJ SOLN: 25.0000 ug | INTRAMUSCULAR | Status: DC | PRN

## 2020-01-20 MED ORDER — OXYCODONE-ACETAMINOPHEN 5-325 MG PO TABS: 1.0000 | ORAL_TABLET | ORAL | 0 refills | Status: DC | PRN

## 2020-01-20 MED ORDER — FENTANYL CITRATE (PF) 100 MCG/2ML IJ SOLN
INTRAMUSCULAR | Status: AC
  Filled 2020-01-20: qty 2

## 2020-01-20 MED ORDER — ONDANSETRON HCL 4 MG/2ML IJ SOLN: 4.0000 mg | Freq: Once | INTRAMUSCULAR | Status: DC | PRN

## 2020-01-20 MED ORDER — ONDANSETRON HCL 4 MG/2ML IJ SOLN
INTRAMUSCULAR | Status: AC
  Filled 2020-01-20: qty 2

## 2020-01-20 MED ORDER — ONDANSETRON HCL 4 MG/2ML IJ SOLN
INTRAMUSCULAR | Status: DC | PRN
  Administered 2020-01-20: 4 mg via INTRAVENOUS

## 2020-01-20 MED ORDER — ACETAMINOPHEN 650 MG RE SUPP
650.0000 mg | RECTAL | Status: DC | PRN
  Filled 2020-01-20: qty 1

## 2020-01-20 MED ORDER — ROCURONIUM BROMIDE 100 MG/10ML IV SOLN
INTRAVENOUS | Status: DC | PRN
  Administered 2020-01-20: 50 mg via INTRAVENOUS

## 2020-01-20 MED ORDER — FENTANYL CITRATE (PF) 100 MCG/2ML IJ SOLN
INTRAMUSCULAR | Status: DC | PRN
  Administered 2020-01-20: 50 ug via INTRAVENOUS

## 2020-01-20 MED ORDER — OXYCODONE-ACETAMINOPHEN 5-325 MG PO TABS: 1.0000 | ORAL_TABLET | ORAL | Status: DC | PRN

## 2020-01-20 MED ORDER — SUGAMMADEX SODIUM 500 MG/5ML IV SOLN
INTRAVENOUS | Status: DC | PRN
  Administered 2020-01-20: 200 mg via INTRAVENOUS

## 2020-01-20 SURGICAL SUPPLY — 14 items
CATH ROBINSON RED A/P 16FR (CATHETERS) ×3 IMPLANT
COVER WAND RF STERILE (DRAPES) IMPLANT
GLOVE BIO SURGEON STRL SZ8 (GLOVE) ×3 IMPLANT
GOWN STRL REUS W/ TWL LRG LVL3 (GOWN DISPOSABLE) ×1 IMPLANT
GOWN STRL REUS W/ TWL XL LVL3 (GOWN DISPOSABLE) ×1 IMPLANT
GOWN STRL REUS W/TWL LRG LVL3 (GOWN DISPOSABLE) ×2
GOWN STRL REUS W/TWL XL LVL3 (GOWN DISPOSABLE) ×2
KIT PROCEDURE FLUENT (KITS) ×3 IMPLANT
KIT TURNOVER CYSTO (KITS) ×3 IMPLANT
PACK DNC HYST (MISCELLANEOUS) ×3 IMPLANT
PAD OB MATERNITY 4.3X12.25 (PERSONAL CARE ITEMS) ×3 IMPLANT
PAD PREP 24X41 OB/GYN DISP (PERSONAL CARE ITEMS) ×3 IMPLANT
SEAL ROD LENS SCOPE MYOSURE (ABLATOR) ×3 IMPLANT
TOWEL OR 17X26 4PK STRL BLUE (TOWEL DISPOSABLE) ×3 IMPLANT

## 2020-01-20 NOTE — Op Note (Signed)
Operative Note  01/20/2020  PRE-OP DIAGNOSIS: Menometrorrhagia  POST-OP DIAGNOSIS: same   SURGEON: Annamarie Major, MD, FACOG  PROCEDURE: Procedure(s): DILATATION AND CURETTAGE /HYSTEROSCOPY   ANESTHESIA: Choice   ESTIMATED BLOOD LOSS: Min   SPECIMENS:  EMC  FLUID DEFICIT: Min  COMPLICATIONS: None  DISPOSITION: PACU - hemodynamically stable.  CONDITION: stable  FINDINGS: Exam under anesthesia revealed small, mobile uterus with no masses and bilateral adnexa without masses or fullness. Hysteroscopy revealed a grossly normal appearing uterine cavity with bilateral tubal ostia and normal appearing endocervical canal.  PROCEDURE IN DETAIL: After informed consent was obtained, the patient was taken to the operating room where anesthesia was obtained without difficulty. The patient was positioned in the dorsal lithotomy position in Hemlock stirrups. The patient's bladder was catheterized with an in and out foley catheter. The patient was examined under anesthesia, with the above noted findings. The weightedspeculum was placed inside the patient's vagina, and the the anterior lip of the cervix was seen and grasped with the tenaculum.  An Endocervical specimen was obtained with a kevorkian curette. The uterine cavity was sounded to 8cm, and then the cervix was progressively dilated to a 18 French-Pratt dilator. The 30 degree hysteroscope was introduced, with saline fluid used to distend the intrauterine cavity, with the above noted findings.  The hystersocope was removed and the uterine cavity was curetted until a gritty texture was noted, yielding endometrial curettings. Excellent hemostasis was noted, and all instruments were removed, with excellent hemostasis noted throughout. She was then taken out of dorsal lithotomy. Minimal discrepancy in fluid was noted.  The patient tolerated the procedure well. Sponge, lap and needle counts were correct x2. The patient was taken to recovery room in  excellent condition.  Annamarie Major, MD, [facility], [facility] 01/20/2020  10:59 AM

## 2020-01-20 NOTE — Anesthesia Preprocedure Evaluation (Signed)
Anesthesia Evaluation  Patient identified by MRN, date of birth, ID band Patient awake    Reviewed: Allergy & Precautions, H&P , NPO status , Patient's Chart, lab work & pertinent test results, reviewed documented beta blocker date and time   Airway Mallampati: II  TM Distance: >3 FB Neck ROM: full    Dental  (+) Teeth Intact   Pulmonary neg pulmonary ROS, Patient abstained from smoking.,    Pulmonary exam normal        Cardiovascular Exercise Tolerance: Good negative cardio ROS Normal cardiovascular exam Rhythm:regular Rate:Normal     Neuro/Psych PSYCHIATRIC DISORDERS Depression negative neurological ROS     GI/Hepatic negative GI ROS, Neg liver ROS,   Endo/Other  Morbid obesity  Renal/GU negative Renal ROS  negative genitourinary   Musculoskeletal   Abdominal   Peds  Hematology  (+) Blood dyscrasia, anemia ,   Anesthesia Other Findings Past Medical History: No date: Anemia No date: Depression No date: Family history of adverse reaction to anesthesia     Comment:  mother's local anesthesia did not work.  Past Surgical History: No date: DILATION AND CURETTAGE OF UTERUS No date: TONSILLECTOMY BMI    Body Mass Index: 39.52 kg/m     Reproductive/Obstetrics negative OB ROS                             Anesthesia Physical Anesthesia Plan  ASA: III  Anesthesia Plan: General ETT   Post-op Pain Management:    Induction:   PONV Risk Score and Plan: 4 or greater  Airway Management Planned:   Additional Equipment:   Intra-op Plan:   Post-operative Plan:   Informed Consent: I have reviewed the patients History and Physical, chart, labs and discussed the procedure including the risks, benefits and alternatives for the proposed anesthesia with the patient or authorized representative who has indicated his/her understanding and acceptance.     Dental Advisory Given  Plan  Discussed with: CRNA  Anesthesia Plan Comments:         Anesthesia Quick Evaluation

## 2020-01-20 NOTE — Anesthesia Procedure Notes (Signed)
Procedure Name: Intubation Date/Time: 01/20/2020 10:20 AM Performed by: Gentry Fitz, CRNA Pre-anesthesia Checklist: Patient identified, Emergency Drugs available, Suction available and Patient being monitored Patient Re-evaluated:Patient Re-evaluated prior to induction Oxygen Delivery Method: Circle system utilized Preoxygenation: Pre-oxygenation with 100% oxygen Induction Type: IV induction Ventilation: Mask ventilation without difficulty Laryngoscope Size: Mac and 4 Grade View: Grade I Tube type: Oral Tube size: 7.0 mm Airway Equipment and Method: Stylet Placement Confirmation: ETT inserted through vocal cords under direct vision,  positive ETCO2 and CO2 detector Secured at: 22 cm Tube secured with: Tape Dental Injury: Teeth and Oropharynx as per pre-operative assessment

## 2020-01-20 NOTE — Discharge Instructions (Addendum)
Restart Birth Control Pills SUNDAY  Dilation and Curettage or Vacuum Curettage, Care After These instructions give you information about caring for yourself after your procedure. Your doctor may also give you more specific instructions. Call your doctor if you have any problems or questions after your procedure. Follow these instructions at home: Activity  Do not drive or use heavy machinery while taking prescription pain medicine.  For 24 hours after your procedure, avoid driving.  Take short walks often, followed by rest periods. Ask your doctor what activities are safe for you. After one or two days, you may be able to return to your normal activities.  Do not lift anything that is heavier than 10 lb (4.5 kg) until your doctor approves.  For at least 2 weeks, or as long as told by your doctor: ? Do not douche. ? Do not use tampons. ? Do not have sex. General instructions   Take over-the-counter and prescription medicines only as told by your doctor. This is very important if you take blood thinning medicine.  Do not take baths, swim, or use a hot tub until your doctor approves. Take showers instead of baths.  Wear compression stockings as told by your doctor.  It is up to you to get the results of your procedure. Ask your doctor when your results will be ready.  Keep all follow-up visits as told by your doctor. This is important. Contact a doctor if:  You have very bad cramps that get worse or do not get better with medicine.  You have very bad pain in your belly (abdomen).  You cannot drink fluids without throwing up (vomiting).  You get pain in a different part of the area between your belly and thighs (pelvis).  You have bad-smelling discharge from your vagina.  You have a rash. Get help right away if:  You are bleeding a lot from your vagina. A lot of bleeding means soaking more than one sanitary pad in an hour, for 2 hours in a row.  You have clumps of blood  (blood clots) coming from your vagina.  You have a fever or chills.  Your belly feels very tender or hard.  You have chest pain.  You have trouble breathing.  You cough up blood.  You feel dizzy.  You feel light-headed.  You pass out (faint).  You have pain in your neck or shoulder area. Summary  Take short walks often, followed by rest periods. Ask your doctor what activities are safe for you. After one or two days, you may be able to return to your normal activities.  Do not lift anything that is heavier than 10 lb (4.5 kg) until your doctor approves.  Do not take baths, swim, or use a hot tub until your doctor approves. Take showers instead of baths.  Contact your doctor if you have any symptoms of infection, like bad-smelling discharge from your vagina. This information is not intended to replace advice given to you by your health care provider. Make sure you discuss any questions you have with your health care provider. Document Revised: 09/19/2017 Document Reviewed: 06/24/2016 Elsevier Patient Education  2020 [place]   1) The drugs that you were given will stay in your system until tomorrow so for the next 24 hours you should not:  A) Drive an automobile B) Make any legal decisions C) Drink any alcoholic beverage   2) You may resume regular meals tomorrow.  Today  it is better to start with liquids and gradually work up to solid foods.  You may eat anything you prefer, but it is better to start with liquids, then soup and crackers, and gradually work up to solid foods.   3) Please notify your doctor immediately if you have any unusual bleeding, trouble breathing, redness and pain at the surgery site, drainage, fever, or pain not relieved by medication.    4) Additional Instructions:   Please contact your physician with any problems or Same Day Surgery at [phone], Monday through Friday 6 am to 4 pm,  or [place] at [facility] number at [phone].

## 2020-01-20 NOTE — Interval H&P Note (Signed)
History and Physical Interval Note:  01/20/2020 9:46 AM  Meagan Graham  has presented today for surgery, with the diagnosis of Menometrorrhagia N92.1.  The various methods of treatment have been discussed with the patient and family. After consideration of risks, benefits and other options for treatment, the patient has consented to  Procedure(s): DILATATION AND CURETTAGE /HYSTEROSCOPY (N/A) as a surgical intervention.  The patient's history has been reviewed, patient examined, no change in status, stable for surgery.  I have reviewed the patient's chart and labs.  Questions were answered to the patient's satisfaction.     Letitia Libra

## 2020-01-20 NOTE — Transfer of Care (Signed)
Immediate Anesthesia Transfer of Care Note  Patient: Meagan Graham  Procedure(s) Performed: DILATATION AND CURETTAGE /HYSTEROSCOPY (N/A )  Patient Location: PACU  Anesthesia Type:General  Level of Consciousness: awake, alert , oriented and drowsy  Airway & Oxygen Therapy: Patient Spontanous Breathing and Patient connected to face mask oxygen  Post-op Assessment: Report given to RN and Post -op Vital signs reviewed and stable  Post vital signs: Reviewed and stable  Last Vitals:  Vitals Value Taken Time  BP 127/65 01/20/20 1111  Temp    Pulse 83 01/20/20 1114  Resp 29 01/20/20 1114  SpO2 86 % 01/20/20 1114  Vitals shown include unvalidated device data.  Last Pain:  Vitals:   01/20/20 1110  TempSrc:   PainSc: (P) 0-No pain         Complications: No apparent anesthesia complications

## 2020-01-21 LAB — SURGICAL PATHOLOGY

## 2020-01-24 ENCOUNTER — Telehealth: Payer: Self-pay

## 2020-01-24 NOTE — Telephone Encounter (Signed)
Pt states her uvula is turned white and she started vaginal bleeding not sure if its from starting the [facility] Sunday. Please advise

## 2020-01-25 ENCOUNTER — Telehealth: Payer: Self-pay

## 2020-01-25 NOTE — Telephone Encounter (Signed)
Pt also wants to know if its fine to take her vitamins, I assured her it would be fine, she states that the pain meds helped her throat, wanted to know if she could get a refill. I advised her to try ibuprofen and tylenol

## 2020-01-25 NOTE — Telephone Encounter (Signed)
Pt states it from the tube that was in her throat, when she was put to sleep for surgery, she's looked it up on line and it uvula necrosis, is this common after surgery?

## 2020-01-25 NOTE — Anesthesia Postprocedure Evaluation (Signed)
Anesthesia Post Note  Patient: Ryah Marques  Procedure(s) Performed: DILATATION AND CURETTAGE /HYSTEROSCOPY (N/A )  Patient location during evaluation: PACU Anesthesia Type: General Level of consciousness: awake and alert Pain management: pain level controlled Vital Signs Assessment: post-procedure vital signs reviewed and stable Respiratory status: spontaneous breathing, nonlabored ventilation, respiratory function stable and patient connected to nasal cannula oxygen Cardiovascular status: blood pressure returned to baseline and stable Postop Assessment: no apparent nausea or vomiting Anesthetic complications: no     Last Vitals:  Vitals:   01/20/20 1210 01/20/20 1222  BP: 115/68 121/69  Pulse: 79 77  Resp: 20 16  Temp:  (!) 36.3 C  SpO2: 92% 97%    Last Pain:  Vitals:   01/20/20 1222  TempSrc: Temporal  PainSc: 0-No pain                 Yevette Edwards

## 2020-01-25 NOTE — Telephone Encounter (Signed)
Left message to advise pt  

## 2020-01-25 NOTE — Telephone Encounter (Signed)
Not sure about uvula turning white.  Bleeding, we need to monitor.  Post surgery and now new pack of hormone pills.

## 2020-01-25 NOTE — Telephone Encounter (Signed)
Pt states that a piece of her uvula is about to fall off. still white, very painfull to eat anything, She states the bleeding is on and off,bright red, has to wear a pad. she's aware to monitor it

## 2020-02-07 ENCOUNTER — Encounter: Payer: Self-pay | Admitting: Obstetrics & Gynecology

## 2020-02-07 ENCOUNTER — Other Ambulatory Visit: Payer: Self-pay

## 2020-02-07 ENCOUNTER — Ambulatory Visit (INDEPENDENT_AMBULATORY_CARE_PROVIDER_SITE_OTHER): Payer: PRIVATE HEALTH INSURANCE | Admitting: Obstetrics & Gynecology

## 2020-02-07 VITALS — Ht 63.0 in | Wt 220.0 lb

## 2020-02-07 DIAGNOSIS — N921 Excessive and frequent menstruation with irregular cycle: Secondary | ICD-10-CM

## 2020-02-07 NOTE — Progress Notes (Signed)
Virtual Visit via Telephone Note  I connected with Meagan Graham on 02/07/20 at  1:50 PM EDT by telephone and verified that I am speaking with the correct person using two identifiers.   I discussed the limitations, risks, security and privacy concerns of performing an evaluation and management service by telephone and the availability of in person appointments. I also discussed with the patient that there may be a patient responsible charge related to this service. The patient expressed understanding and agreed to proceed. She was at home and I was in my office.  History of Present Illness:      [name] is a 29 y.o. G2P1011 who LMP was 3 weeks ago, presents today for a problem visit.  She complains of a history of heavier menses despite OCP use that  began last month to the point of having a D&C to help rectify the bleeding and pain; afterwards she feels much improved w no bleeding now and no current pai and the prior pain severity is described as moderate.  She usually has regular periods every 28 days and they are associated with mild menstrual cramping.  Sh  PMHx: She  has a past medical history of Anemia, Depression, and Family history of adverse reaction to anesthesia. Also,  has a past surgical history that includes Tonsillectomy; Dilation and curettage of uterus; and Hysteroscopy with D & C (N/A, 01/20/2020)., family history includes Melanoma (age of onset: 83) in her mother.,  reports that she has never smoked. She has never used smokeless tobacco. She reports current alcohol use. She reports that she does not use drugs.  She  Current Outpatient Medications:  .  APPLE CIDER VINEGAR PO, Take by mouth., Disp: , Rfl:  .  Biotin 10 MG CAPS, Take by mouth., Disp: , Rfl:  .  busPIRone (BUSPAR) 5 MG tablet, Take 5 mg by mouth 2 (two) times daily., Disp: , Rfl:  .  Cetirizine HCl 10 MG TBDP, Take by mouth., Disp: , Rfl:  .  cholecalciferol (VITAMIN D3) 25 MCG (1000 UNIT) tablet,  Take 1,000 Units by mouth daily., Disp: , Rfl:  .  cyanocobalamin 1000 MCG tablet, Take by mouth., Disp: , Rfl:  .  escitalopram (LEXAPRO) 5 MG tablet, Take by mouth., Disp: , Rfl:  .  ferrous sulfate 324 MG TBEC, Take 324 mg by mouth., Disp: , Rfl:  .  fluocinonide cream (LIDEX) 9.83 %, Apply 1 application topically 2 (two) times daily., Disp: , Rfl:  .  ketoconazole (NIZORAL) 2 % shampoo, Apply 1-3 times a week to scalp. Let sit 10 min then rinse out., Disp: , Rfl:  .  Norethindrone Acetate-Ethinyl Estradiol (LOESTRIN) 1.5-30 MG-MCG tablet, TAKE 1 TABLET BY MOUTH EVERY DAY FOR 21 DAYS, NO PILLS FOR 7 DAYS THEN START NEW PACK AS DIRECTED, Disp: , Rfl:  .  Omega-3 Fatty Acids (FISH OIL) 1000 MG CAPS, Take by mouth., Disp: , Rfl:  .  Turmeric (QC TUMERIC COMPLEX PO), Take by mouth., Disp: , Rfl:   Also, has No Known Allergies.  Review of Systems  All other systems reviewed and are negative.   Observations/Objective: No exam today, due to telephone eVisit due to [facility] virus restriction on elective visits and procedures.  Prior visits reviewed along with ultrasounds/labs as indicated.  Assessment and Plan:   ICD-10-CM   1. Menometrorrhagia  N92.1   Plans to cont OCPs Monitor cycles PAP UTD 12/2019 Adenomyosis discussed (on path from [facility])  Follow Up Instructions: Monitor  cycles next couple of months   I discussed the assessment and treatment plan with the patient. The patient was provided an opportunity to ask questions and all were answered. The patient agreed with the plan and demonstrated an understanding of the instructions.   The patient was advised to call back or seek an in-person evaluation if the symptoms worsen or if the condition fails to improve as anticipated.  I provided 15 minutes of non-face-to-face time during this encounter (phone call, review of records, labs, and pathology)   Letitia Libra, MD

## 2020-02-21 ENCOUNTER — Telehealth: Payer: Self-pay

## 2020-02-21 NOTE — Telephone Encounter (Signed)
Left message to advise pt  

## 2020-02-21 NOTE — Telephone Encounter (Signed)
Yes, would recommend giving pill full opportunity to work to improve bleeding and periods cycles, such as a couple of months at least.  No worries, as she has had D&C already so no new findings to worry about; this is more a hormonal concern it seems

## 2020-02-21 NOTE — Telephone Encounter (Signed)
Pt changing her pad every 1 1/2 hours bleeding heavy and 2nd day on the bc.pill. she started spotting Tuesday and Wednesday was a full blown period and today is heavy, should she just wait this out or should she be worried please advise

## 2021-11-07 ENCOUNTER — Ambulatory Visit (LOCAL_COMMUNITY_HEALTH_CENTER): Payer: Self-pay

## 2021-11-07 ENCOUNTER — Other Ambulatory Visit: Payer: Self-pay

## 2021-11-07 VITALS — BP 93/63 | HR 81 | Ht 63.0 in | Wt 182.0 lb

## 2021-11-07 DIAGNOSIS — Z3201 Encounter for pregnancy test, result positive: Secondary | ICD-10-CM

## 2021-11-07 LAB — PREGNANCY, URINE: Preg Test, Ur: POSITIVE — AB

## 2021-11-07 NOTE — Progress Notes (Signed)
Plans PNC at [facility] with [facility] delivery. Plans preadmission today. Jossie Ng, RN

## 2021-11-20 DIAGNOSIS — R102 Pelvic and perineal pain: Secondary | ICD-10-CM | POA: Diagnosis not present

## 2021-11-20 DIAGNOSIS — O26851 Spotting complicating pregnancy, first trimester: Secondary | ICD-10-CM | POA: Diagnosis present

## 2021-11-20 DIAGNOSIS — Z3A01 Less than 8 weeks gestation of pregnancy: Secondary | ICD-10-CM | POA: Diagnosis not present

## 2021-11-20 DIAGNOSIS — Z8759 Personal history of other complications of pregnancy, childbirth and the puerperium: Secondary | ICD-10-CM | POA: Diagnosis not present

## 2021-11-20 DIAGNOSIS — J45909 Unspecified asthma, uncomplicated: Secondary | ICD-10-CM | POA: Diagnosis not present

## 2021-11-20 DIAGNOSIS — O99511 Diseases of the respiratory system complicating pregnancy, first trimester: Secondary | ICD-10-CM | POA: Diagnosis not present

## 2021-11-20 DIAGNOSIS — N83201 Unspecified ovarian cyst, right side: Secondary | ICD-10-CM | POA: Insufficient documentation

## 2021-11-20 DIAGNOSIS — R58 Hemorrhage, not elsewhere classified: Secondary | ICD-10-CM

## 2021-11-20 NOTE — ED Triage Notes (Signed)
Patient ambulatory to triage with steady gait, without difficulty or distress noted; pt reports approx [redacted]wks pregnant; G3P1A1; 1st appt 2/8 with [facility]; began having vag bleeding tonight, no pain; hx indicates pt O+

## 2021-11-21 ENCOUNTER — Other Ambulatory Visit: Payer: Self-pay

## 2021-11-21 ENCOUNTER — Emergency Department
Admission: EM | Admit: 2021-11-21 | Discharge: 2021-11-21 | Disposition: A | Discharge status: Home / Self Care | Payer: No Typology Code available for payment source | Attending: Emergency Medicine | Admitting: Emergency Medicine

## 2021-11-21 ENCOUNTER — Emergency Department: Payer: No Typology Code available for payment source

## 2021-11-21 ENCOUNTER — Encounter: Payer: Self-pay | Admitting: Emergency Medicine

## 2021-11-21 DIAGNOSIS — R102 Pelvic and perineal pain: Secondary | ICD-10-CM

## 2021-11-21 DIAGNOSIS — R58 Hemorrhage, not elsewhere classified: Secondary | ICD-10-CM

## 2021-11-21 DIAGNOSIS — O4690 Antepartum hemorrhage, unspecified, unspecified trimester: Secondary | ICD-10-CM

## 2021-11-21 LAB — CBC WITH DIFFERENTIAL/PLATELET
Abs Immature Granulocytes: 0.01 10*3/uL (ref 0.00–0.07)
Basophils Absolute: 0 10*3/uL (ref 0.0–0.1)
Basophils Relative: 1 %
Eosinophils Absolute: 0 10*3/uL (ref 0.0–0.5)
Eosinophils Relative: 0 %
HCT: 39.7 % (ref 36.0–46.0)
Hemoglobin: 12.9 g/dL (ref 12.0–15.0)
Immature Granulocytes: 0 %
Lymphocytes Relative: 46 %
Lymphs Abs: 2.8 10*3/uL (ref 0.7–4.0)
MCH: 27.3 pg (ref 26.0–34.0)
MCHC: 32.5 g/dL (ref 30.0–36.0)
MCV: 83.9 fL (ref 80.0–100.0)
Monocytes Absolute: 0.4 10*3/uL (ref 0.1–1.0)
Monocytes Relative: 6 %
Neutro Abs: 2.8 10*3/uL (ref 1.7–7.7)
Neutrophils Relative %: 47 %
Platelets: 237 10*3/uL (ref 150–400)
RBC: 4.73 MIL/uL (ref 3.87–5.11)
RDW: 13.2 % (ref 11.5–15.5)
WBC: 6 10*3/uL (ref 4.0–10.5)
nRBC: 0 % (ref 0.0–0.2)

## 2021-11-21 LAB — POC URINE PREG, ED: Preg Test, Ur: POSITIVE — AB

## 2021-11-21 LAB — URINALYSIS, ROUTINE W REFLEX MICROSCOPIC
Bilirubin Urine: NEGATIVE
Glucose, UA: 100 mg/dL — AB
Ketones, ur: NEGATIVE mg/dL
Nitrite: NEGATIVE
Protein, ur: NEGATIVE mg/dL
Specific Gravity, Urine: 1.015 (ref 1.005–1.030)
pH: 5.5 (ref 5.0–8.0)

## 2021-11-21 LAB — URINALYSIS, MICROSCOPIC (REFLEX): Bacteria, UA: NONE SEEN

## 2021-11-21 LAB — COMPREHENSIVE METABOLIC PANEL
ALT: 18 U/L (ref 0–44)
AST: 19 U/L (ref 15–41)
Albumin: 4.4 g/dL (ref 3.5–5.0)
Alkaline Phosphatase: 52 U/L (ref 38–126)
Anion gap: 6 (ref 5–15)
BUN: 14 mg/dL (ref 6–20)
CO2: 25 mmol/L (ref 22–32)
Calcium: 10 mg/dL (ref 8.9–10.3)
Chloride: 103 mmol/L (ref 98–111)
Creatinine, Ser: 0.7 mg/dL (ref 0.44–1.00)
GFR, Estimated: >60 mL/min (ref >60)
Glucose, Bld: 105 mg/dL — ABNORMAL HIGH (ref 70–99)
Potassium: 4 mmol/L (ref 3.5–5.1)
Sodium: 134 mmol/L — ABNORMAL LOW (ref 135–145)
Total Bilirubin: 0.8 mg/dL (ref 0.3–1.2)
Total Protein: 7.2 g/dL (ref 6.5–8.1)

## 2021-11-21 LAB — HCG, QUANTITATIVE, PREGNANCY: hCG, Beta Chain, Quant, S: 33523 m[IU]/mL — ABNORMAL HIGH (ref <5)

## 2021-11-21 NOTE — Discharge Instructions (Signed)
I recommended close follow-up with the health department.  Your ultrasound today showed a pregnancy within the uterus that was measuring approximately 5 weeks, 6 days but no cardiac activity.  This could mean that it is just too early to detect cardiac activity but could also be concerning for a failed pregnancy especially given your last menstrual cycle was November 22 and based on these dates you should be measuring approximately 10 weeks and 1 day.  We recommend that you have a repeat ultrasound in 10 to 14 days and this can be done by the health department.  They may also want to monitor your hCG levels to see if they are trending up or down.  Your hCG level today was 33,523.   Unfortunately if this is a failed pregnancy, you may begin having vaginal bleeding.  You may use over-the-counter Tylenol and ibuprofen as needed for pain control.  If your pain is severe and uncontrolled with pain medication over-the-counter, you have a fever of 100.4 or higher, vomiting and cannot stop or bleeding so heavily that you soak through more than a pad an hour for more than 2 straight hours or feel like you might pass out, please return to the emergency department.

## 2021-11-21 NOTE — ED Provider Notes (Signed)
[facility] Provider Note    Event Date/Time   First MD Initiated Contact with Patient 11/21/21 0335     (approximate)   History   Vaginal Bleeding   HPI  Meagan Graham is a 31 y.o. female  G40P1011 who presents to the emergency department with concerns for seeing brown discharge when wiping after urinating while at work tonight.  She has not seen any other vaginal bleeding or abnormal discharge.  She states she is pregnant with a last menstrual period of September 11, 2021.  This would put her at about 10 weeks and 1 day.  She states she has had 1 previous miscarriage.  She was previously seen by Dr. Kenton Kingfisher with OB/GYN but no longer has insurance and has now been going to the health department.  She has seen them once and had a confirmatory test for her pregnancy but has not yet had an ultrasound.  Her next appointment is February 8.  She denies any abdominal pain, dysuria, hematuria, fevers, vomiting.  She denies any concern for STDs and declines testing, pelvic exam today.      History provided by patient.    Past Medical History:  Diagnosis Date   Anemia    Depression    Eczema    Family history of adverse reaction to anesthesia    mother's local anesthesia did not work.    History of asthma    child   History of pre-eclampsia     Past Surgical History:  Procedure Laterality Date   HYSTEROSCOPY WITH D & C N/A 01/20/2020   Procedure: DILATATION AND CURETTAGE /HYSTEROSCOPY;  Surgeon: Gae Dry, MD;  Location: [facility] ORS;  Service: Gynecology;  Laterality: N/A;   TONSILLECTOMY AND ADENOIDECTOMY     toddler    MEDICATIONS:  Prior to Admission medications   Medication Sig Start Date End Date Taking? Authorizing Provider  APPLE CIDER VINEGAR PO Take by mouth. Patient not taking: Reported on 11/07/2021    [provider]  Biotin 10 MG CAPS Take by mouth. Patient not taking: Reported on 11/07/2021    [provider]   busPIRone (BUSPAR) 5 MG tablet Take 5 mg by mouth 2 (two) times daily. Patient not taking: Reported on 11/07/2021 12/28/19   [provider]  Cetirizine HCl 10 MG TBDP Take by mouth. Patient not taking: Reported on 11/07/2021    [provider]  cholecalciferol (VITAMIN D3) 25 MCG (1000 UNIT) tablet Take 1,000 Units by mouth daily. Patient not taking: Reported on 11/07/2021    [provider]  cyanocobalamin 1000 MCG tablet Take by mouth. Patient not taking: Reported on 11/07/2021    [provider]  escitalopram (LEXAPRO) 5 MG tablet Take by mouth. Patient not taking: Reported on 11/07/2021 11/14/19   [provider]  ferrous sulfate 324 MG TBEC Take 324 mg by mouth.    [provider]  fluocinonide cream (LIDEX) [id number] % Apply 1 application topically 2 (two) times daily. 11/25/19   [provider]  ketoconazole (NIZORAL) 2 % shampoo Apply 1-3 times a week to scalp. Let sit 10 min then rinse out. 11/24/19   [provider]  Norethindrone Acetate-Ethinyl Estradiol (LOESTRIN) 1.5-30 MG-MCG tablet TAKE 1 TABLET BY MOUTH EVERY DAY FOR 21 DAYS, NO PILLS FOR 7 DAYS THEN START NEW PACK AS DIRECTED 11/08/19   [provider]  Omega-3 Fatty Acids (FISH OIL) 1000 MG CAPS Take by mouth.    [provider]  Prenatal Vit-Fe Fumarate-FA (MULTIVITAMIN-PRENATAL) 27-0.8 MG TABS tablet Take 1 tablet by mouth daily at 12 noon.    [provider]  Turmeric (QC TUMERIC COMPLEX PO) Take by mouth.    [provider]    Physical Exam   Triage Vital Signs: ED Triage Vitals  Enc Vitals Group     BP 11/20/21 2357 121/74     Pulse Rate 11/20/21 2357 77     Resp 11/20/21 2357 16     Temp 11/20/21 2357 98.9 F (37.2 C)     Temp Source 11/20/21 2357 Oral     SpO2 11/20/21 2357 100 %     Weight 11/20/21 2359 179 lb (81.2 kg)     Height 11/20/21 2359 5\' 3"  (1.6 m)     Head Circumference --      Peak Flow --      Pain  Score 11/20/21 2359 0     Pain Loc --      Pain Edu? --      Excl. in [place]? --     Most recent vital signs: Vitals:   11/20/21 2357 11/21/21 0308  BP: 121/74 113/71  Pulse: 77 83  Resp: 16 18  Temp: 98.9 F (37.2 C)   SpO2: 100% 100%    CONSTITUTIONAL: Alert and oriented and responds appropriately to questions. Well-appearing; well-nourished HEAD: Normocephalic, atraumatic EYES: Conjunctivae clear, pupils appear equal, sclera nonicteric ENT: normal nose; moist mucous membranes NECK: Supple, normal ROM CARD: RRR; S1 and S2 appreciated; no murmurs, no clicks, no rubs, no gallops RESP: Normal chest excursion without splinting or tachypnea; breath sounds clear and equal bilaterally; no wheezes, no rhonchi, no rales, no hypoxia or respiratory distress, speaking full sentences ABD/GI: Normal bowel sounds; non-distended; soft, non-tender, no rebound, no guarding, no peritoneal signs GU: Patient deferred BACK: The back appears normal EXT: Normal ROM in all joints; no deformity noted, no edema; no cyanosis SKIN: Normal color for age and race; warm; no rash on exposed skin NEURO: Moves all extremities equally, normal speech PSYCH: The patient's mood and manner are appropriate.   ED Results / Procedures / Treatments   LABS: (all labs ordered are listed, but only abnormal results are displayed) Labs Reviewed  COMPREHENSIVE METABOLIC PANEL - Abnormal; Notable for the following components:      Result Value   Sodium 134 (*)    Glucose, Bld 105 (*)    All other components within normal limits  HCG, QUANTITATIVE, PREGNANCY - Abnormal; Notable for the following components:   hCG, Beta Chain, Quant, S 33,523 (*)    All other components within normal limits  URINALYSIS, ROUTINE W REFLEX MICROSCOPIC - Abnormal; Notable for the following components:   Glucose, UA 100 (*)    Hgb urine dipstick TRACE (*)    Leukocytes,Ua TRACE (*)    All other components within normal limits  POC URINE  PREG, ED - Abnormal; Notable for the following components:   Preg Test, Ur Positive (*)    All other components within normal limits  CBC WITH DIFFERENTIAL/PLATELET  URINALYSIS, MICROSCOPIC (REFLEX)     EKG:   RADIOLOGY: My personal review and interpretation of imaging: Ultrasound shows yolk sac, fetal pole but no cardiac activity.  No subchorionic hemorrhage.  I have personally reviewed all radiology reports.   US OB LESS THAN 14 WEEKS W/ OB TRANSVAGINAL AND DOPPLER  Result Date: 11/21/2021 CLINICAL DATA:  31 year old female with vaginal bleeding and pelvic pain in the 1st trimester of  pregnancy. Estimated gestational age by LMP 10 weeks and 1 day. Quantitative beta HCG [id number]. EXAM: OBSTETRIC <14 WK [place] AND TRANSVAGINAL OB US DOPPLER ULTRASOUND OF OVARIES TECHNIQUE: Both transabdominal and transvaginal ultrasound examinations were performed for complete evaluation of the gestation as well as the maternal uterus, adnexal regions, and pelvic cul-de-sac. Transvaginal technique was performed to assess early pregnancy. Color and duplex Doppler ultrasound was utilized to evaluate blood flow to the ovaries. COMPARISON:  None relevant. FINDINGS: Intrauterine gestational sac: Single Yolk sac:  Visible (image 55) Embryo:  Questionable (image 61) Cardiac Activity: Not detected MSD: 30.8 mm   8 w   2 d CRL:   3.1 mm   5 w 6 d Subchorionic hemorrhage:  None visualized. Maternal uterus/adnexae: No pelvic free fluid. A 21 mm simple appearing cyst with no Doppler vascular elements is identified adjacent to the right ovary (image 89). The right ovary is otherwise normal measuring 3.0 x 1.8 by 2.7 cm (8 mL) with preserved color and spectral Doppler flow (image 98). The left ovary measures 3.7 x 2.3 by 4.1 cm (18 mL) and appears normal with preserved color and spectral Doppler flow (image 45). IMPRESSION: 1. Single intrauterine gestational sac with yolk sac and questionable fetal pole visible. No cardiac activity  detected.  Discrepant MSD and presumed CRL. Findings are suspicious but not yet definitive for failed pregnancy. Recommend follow-up US in 10-14 days for definitive diagnosis. This recommendation follows SRU consensus guidelines: Diagnostic Criteria for Nonviable Pregnancy Early in the First Trimester. [place] Med 2013KT:[id number]. 2. No subchorionic hemorrhage or pelvic free fluid. Simple appearing 2.1 cm right ovarian cyst. Electronically Signed   By: Genevie Ann M.D.   On: 11/21/2021 04:05     PROCEDURES:  Critical Care performed: No     Procedures    IMPRESSION / MDM / ASSESSMENT AND PLAN / ED COURSE  I reviewed the triage vital signs and the nursing notes.    Patient here after seeing a small amount of brown discharge while pregnant.    DIFFERENTIAL DIAGNOSIS (includes but not limited to):   Subchorionic hemorrhage, implantation bleeding, miscarriage, UTI, STD STD, PID, TOA, ectopic, torsion less likely given no pain.  Doubt appendicitis..  Less likely   PLAN: We will obtain CBC, BMP, hCG, urinalysis.  Will obtain transvaginal ultrasound with Doppler.   MEDICATIONS GIVEN IN ED: Medications - No data to display   ED COURSE: Labs show normal hemoglobin of 12.9.  Electrolytes normal.  hCG is 33,000.  Urine shows trace leukocytes but no bacteria or other sign of infection.  I have reviewed her ultrasound as has the radiologist.  There is a single intrauterine gestational sac with a yolk sac and possible fetal pole but no cardiac activity.  Discussed this with patient that this could represent a failed pregnancy versus an early pregnancy.  She is measuring approximately 5 weeks and 6 days based on crown-rump length on ultrasound but based on her last menstrual cycle she would be approximately 10 weeks and 1 day.  No subchorionic hemorrhage seen.  She declines pelvic exam and STD screening today.  I have recommended that she have her hCG rechecked and trended by her OB/GYN with the  health department and also have a repeat ultrasound in 10 to 14 days.  We did discuss that if this was a miscarriage she may begin to bleed and have discussed bleeding return precautions.  Patient verbalized understanding and is comfortable with this plan.  At  this time, I do not feel there is any life-threatening condition present. I reviewed all nursing notes, vitals, pertinent previous records.  All lab and urine results, EKGs, imaging ordered have been independently reviewed and interpreted by myself.  I reviewed all available radiology reports from any imaging ordered this visit.  Based on my assessment, I feel the patient is safe to be discharged home without further emergent workup and can continue workup as an outpatient as needed. Discussed all findings, treatment plan as well as usual and customary return precautions with patient.  They verbalize understanding and are comfortable with this plan.  Outpatient follow-up has been provided as needed.  All questions have been answered.    CONSULTS: No OB/GYN consult needed at this time given no ectopic, torsion.   OUTSIDE RECORDS REVIEWED: Reviewed records from devious ED visit and office visit in March 2021 for dysfunctional uterine bleeding while not pregnant.         FINAL CLINICAL IMPRESSION(S) / ED DIAGNOSES   Final diagnoses:  Bleeding  Pelvic pain  Vaginal bleeding in pregnancy     Rx / DC Orders   ED Discharge Orders     None        Note:  This document was prepared using Dragon voice recognition software and may include unintentional dictation errors.   Aryella Besecker, Delice Bison, DO 11/21/21 0600

## 2021-11-28 ENCOUNTER — Ambulatory Visit: Payer: Medicaid Other | Admitting: Family Medicine

## 2021-11-28 ENCOUNTER — Other Ambulatory Visit: Payer: Self-pay

## 2021-11-28 ENCOUNTER — Encounter: Payer: Self-pay | Admitting: Family Medicine

## 2021-11-28 DIAGNOSIS — Z3490 Encounter for supervision of normal pregnancy, unspecified, unspecified trimester: Secondary | ICD-10-CM

## 2021-11-28 DIAGNOSIS — O209 Hemorrhage in early pregnancy, unspecified: Secondary | ICD-10-CM

## 2021-11-28 NOTE — Progress Notes (Signed)
[facility] Department  Amenorrhea with positive UPT--- VISIT ENCOUNTER NOTE  Subjective:   Meagan Graham is a 31 y.o. [phone] female here for positive pregnancy test.  She has not had serial bhcg. She has been seen at the ER for spotting and had [place] that showed a gestational sac and CRL discrepancy with 33K bhcg which is concerning for failed pregnancy but not definitive.  Her last [place] was 2/1.   Patient has history of SAB in April 2021. Also has heavy and irregular cycles.   Lab results: 2/1  bHCG 33,523   Denies abnormal vaginal bleeding, discharge, pelvic pain, problems with intercourse or other gynecologic concerns.    Gynecologic History Patient's last menstrual period was 09/11/2021.  Health Maintenance Due  Topic Date Due   COVID-19 Vaccine (1) Never done   HIV Screening  Never done   Hepatitis C Screening  Never done   INFLUENZA VACCINE  Never done    The following portions of the patient's history were reviewed and updated as appropriate: allergies, current medications, past family history, past medical history, past social history, past surgical history and problem list.  Review of Systems Pertinent items are noted in HPI.   Objective:  LMP 09/11/2021  Gen: well appearing, NAD HEENT: no scleral icterus CV: RR Lung: Normal WOB Ext: warm well perfused   Assessment and Plan:  1. Vaginal bleeding affecting early pregnancy Blood type in 2021 confirmed to be O positive-- rhogam not indicated Needs repeat US to either confirm failed pregnancy vs viable pregnancy. Appt in 2 weeks for either NOB vs miscarriage management Provided support, patient is tearful appropriately - [place] OP OB Comp Less 14 Wks; Future  2. Early stage of pregnancy - [place] OP OB Comp Less 14 Wks; Future   Please refer to After Visit Summary for other counseling recommendations.   Return in about 2 weeks (around 12/12/2021) for f/u US and viability scan.  Caren Macadam, MD, MPH, ABFM

## 2021-11-28 NOTE — Progress Notes (Signed)
Presents for initiation of prenatal care. Dr. Ernestina Patches reviewed 11/21/2021 [facility] [place] and plans to order repeat US with [facility] follow-up after [place] appt. Per Dt. Newton, no new OB history to be done today. Rich Number, RN Client scheduled for [facility] [place] on 12/03/2021 with arrival time of 1315. Counseled to arrive with full bladder having drunk 32 oz water and not used bathroom. Client verbalized understanding. As per Dr. Ernestina Patches, 12/04/21 appt scheduled for [place] follow-up. Rich Number, RN

## 2021-11-30 ENCOUNTER — Other Ambulatory Visit: Payer: Self-pay | Admitting: Family Medicine

## 2021-11-30 DIAGNOSIS — O3680X0 Pregnancy with inconclusive fetal viability, not applicable or unspecified: Secondary | ICD-10-CM

## 2021-11-30 DIAGNOSIS — O209 Hemorrhage in early pregnancy, unspecified: Secondary | ICD-10-CM

## 2021-11-30 DIAGNOSIS — Z3490 Encounter for supervision of normal pregnancy, unspecified, unspecified trimester: Secondary | ICD-10-CM

## 2021-12-03 ENCOUNTER — Telehealth: Payer: Self-pay

## 2021-12-03 ENCOUNTER — Other Ambulatory Visit: Payer: Self-pay

## 2021-12-03 ENCOUNTER — Ambulatory Visit
Admission: RE | Admit: 2021-12-03 | Discharge: 2021-12-03 | Disposition: A | Discharge status: Home / Self Care | Payer: No Typology Code available for payment source | Source: Ambulatory Visit | Attending: Family Medicine | Admitting: Family Medicine

## 2021-12-03 DIAGNOSIS — O208 Other hemorrhage in early pregnancy: Secondary | ICD-10-CM | POA: Diagnosis present

## 2021-12-03 DIAGNOSIS — O3680X0 Pregnancy with inconclusive fetal viability, not applicable or unspecified: Secondary | ICD-10-CM

## 2021-12-03 DIAGNOSIS — Z3687 Encounter for antenatal screening for uncertain dates: Secondary | ICD-10-CM | POA: Diagnosis not present

## 2021-12-03 DIAGNOSIS — Z3490 Encounter for supervision of normal pregnancy, unspecified, unspecified trimester: Secondary | ICD-10-CM

## 2021-12-03 DIAGNOSIS — O209 Hemorrhage in early pregnancy, unspecified: Secondary | ICD-10-CM

## 2021-12-03 DIAGNOSIS — Z3A08 8 weeks gestation of pregnancy: Secondary | ICD-10-CM | POA: Diagnosis not present

## 2021-12-03 NOTE — Telephone Encounter (Signed)
Returned phone call to patient who called wanting to speak with Edd Fabian about an incident in clinic. Patient was here for new OB visit on 11/28/21. LM with Gayle's office number for patient.Jenetta Downer, RN

## 2021-12-04 ENCOUNTER — Encounter: Payer: Self-pay | Admitting: Advanced Practice Midwife

## 2021-12-04 ENCOUNTER — Ambulatory Visit: Payer: Medicaid Other | Admitting: Advanced Practice Midwife

## 2021-12-04 DIAGNOSIS — O034 Incomplete spontaneous abortion without complication: Secondary | ICD-10-CM

## 2021-12-04 NOTE — Progress Notes (Signed)
31 yo HF [id number] with hx SAB 01/2020 went to ER for spotting with u/s on 11/21/21 with questionable fetal pole visible, no cardiac activity, discrepancy betweeen MSD and presumed CRL. Findings suspicious but not yet definitive for SAB. Recommend f/u u/s in 10-14 days. Bhcg=33,523 F/u u/s on 12/03/21 with no interval change in embryo appearance compared to prior u/s on 11/21/21, no cardiac activity, findings meet definitive criteria for failed pregnancy. RV here on 11/28/21. States having pink-brown spotting on toilet paper only without cramping. Counseled on options and pt desires expectant management. Bhcg today, counseled to check temp daily and go to ER if febrile, sxs of infection, RTC 1 week or if changes mind on management

## 2021-12-05 ENCOUNTER — Telehealth: Payer: Self-pay | Admitting: Advanced Practice Midwife

## 2021-12-05 LAB — BETA HCG QUANT (REF LAB): hCG Quant: 6273 m[IU]/mL

## 2021-12-05 NOTE — Telephone Encounter (Signed)
T/C and message left about bloodwork results from yesterday

## 2021-12-10 ENCOUNTER — Telehealth: Payer: Self-pay

## 2021-12-10 NOTE — Telephone Encounter (Signed)
Patient called clinic to report that she has a scheduled appt tomorrow but wants to know "what can be done today?" Patient states that it has been determined that she has had a miscarriage and was given the option to pass everything or have a D&C. Patient denies heavy bright red vaginal bleeding or passing large clots. Patient states she is having small amounts of brown vaginal discharge with small pieces of "tissue but no really bad cramping only a slight back ache." RN explained that there is not a provider for this morning and no available appts this afternoon but to keep her scheduled appt for 12/11/21 @ 8:20. RN also counseled that if she begins to experience heavy vaginal bleeding and/or passing large clots to go to the local ED otherwise to keep 12/11/21 scheduled appt to determine follow up plan. Patient agreeable to above. Tawny Hopping, RN

## 2021-12-11 ENCOUNTER — Ambulatory Visit: Payer: Medicaid Other | Admitting: Advanced Practice Midwife

## 2021-12-11 ENCOUNTER — Other Ambulatory Visit: Payer: Self-pay

## 2021-12-11 VITALS — BP 112/85 | HR 85 | Temp 98.2°F | Wt 187.0 lb

## 2021-12-11 DIAGNOSIS — O034 Incomplete spontaneous abortion without complication: Secondary | ICD-10-CM | POA: Diagnosis not present

## 2021-12-11 NOTE — Progress Notes (Signed)
31 yo HF [id number] with hx D&C 2021 went to ER for spotting with u/s on 11/21/21 with questionable fetal pole visible, no cardiac activity, discrepancy between MSD and presumed CRL. Findings suspicious but not yet definitive for SAB. Recommend f/u u/s in 10-14 days. Bhcg=33,523. F/u u/s on 12/03/21 with no interval change in embryo appearance compared to prior u/s on 11/21/21, no cardiac activity, findings meet definitive criteria for failed pregnancy. At apt here on 11/28/21 pt desires expectant management. Bchg=6,273. Has had occassional brown spotting 1x/day same as on 11/21/21.  Denies fever, achy body, illness Today pt desires D&C with [facility]. Referral written

## 2021-12-11 NOTE — Progress Notes (Signed)
Referral to [facility] faxed with confirmation.   Floy Sabina, RN

## 2021-12-12 ENCOUNTER — Telehealth: Payer: Self-pay

## 2021-12-12 LAB — BETA HCG QUANT (REF LAB): hCG Quant: 2348 m[IU]/mL

## 2021-12-12 NOTE — Telephone Encounter (Signed)
Call to patient to let her know referral has been now sent to [facility].   All questions answered and patient agrees with plan of care.   Floy Sabina, RN

## 2021-12-12 NOTE — Telephone Encounter (Signed)
Patient returned phone call from E. Sherren Kerns, CNM. Will let provider know.   Did update patient on recent Beta hCG results. Also patient stated she called [facility] regarding scheduling referral and per patient [facility] will not have an MD in office until next week. Patient states she is worried about waiting too long and thinks she might be at risk for infection. Patient states she felt chills yesterday, does not think it was a fever. Also states she "felt dizzy when laying down at my couch yesterday". Patient also states she had some more vaginal bleeding yesterday.   Reassurance given to patient. Patient made aware to check temperature at least daily and to go to ED if she is feverish. Will check hgb on her next visit with [place] on 12/18/21. Will let provider know of above symptoms.   Floy Sabina, RN

## 2021-12-13 ENCOUNTER — Telehealth: Payer: Self-pay

## 2021-12-13 ENCOUNTER — Telehealth: Payer: Self-pay | Admitting: Family Medicine

## 2021-12-13 NOTE — Telephone Encounter (Signed)
Pt started bleeding from her miscarriage and wants to know if keeping her current appt. Or heading to the ER. Please give her a call back ASAP. Thanks

## 2021-12-13 NOTE — Telephone Encounter (Signed)
Returned patient phone call regarding vaginal bleeding. Patient states at around 11:30 am she had a half dollar coin size clot come out while using the restroom. Patient has not had bleeding since then. Patient states she has an appointment today with [facility] this afternoon and I encouraged patient to keep that appointment as they can do an [place] there. She wondered about about to [facility] ED because [facility] told her Dr. Tiburcio Pea is on-call there but I recommended her not to as she might wait a long time to be seen in triage and depending on her bleeding she might not even be able to see Dr. Tiburcio Pea. As patient is not currently bleeding and not in pain, I suggested her to keep [facility] appt. She is worried since they do not accept her insurance, she is worried about cost, I assured patient that there are financial resources that can be given or talked about later but at the moment is it important to receive follow-up care. Patient agreed with plan of care and E. Sciora, CNM heard conversation and agreed with plan of care as well.   Floy Sabina, RN

## 2021-12-13 NOTE — Telephone Encounter (Signed)
[facility] referring for pt desires D&C with [facility]. Paper records in Epic. MD only Dr. Jolayne Panther. Called and left voicemail for patient to call back to be scheduled.

## 2021-12-18 ENCOUNTER — Ambulatory Visit: Payer: Medicaid Other

## 2021-12-18 ENCOUNTER — Inpatient Hospital Stay: Admission: RE | Admit: 2021-12-18 | Payer: Self-pay | Source: Ambulatory Visit

## 2021-12-19 ENCOUNTER — Ambulatory Visit: Admission: RE | Admit: 2021-12-19 | Payer: Self-pay | Source: Home / Self Care | Admitting: Obstetrics and Gynecology

## 2021-12-19 ENCOUNTER — Encounter: Admission: RE | Payer: Self-pay | Source: Home / Self Care

## 2021-12-19 SURGERY — DILATION AND EVACUATION, UTERUS
Anesthesia: Choice

## 2022-07-12 IMAGING — US US OB < 14 WKS - US OB TV - US DOPPLER
1 series · 13 of 28 positions shown · non-contrast
Comparison: None relevant.

CLINICAL DATA: 30-year-old female with vaginal bleeding and pelvic
pain in the 1st trimester of pregnancy. Estimated gestational age by
LMP 10 weeks and 1 day. Quantitative beta HCG [DATE].



[Series 1: us ob less than 14 weeks w/ ob transvaginal and do · 13 of 118 slices shown]
[im 5/118]
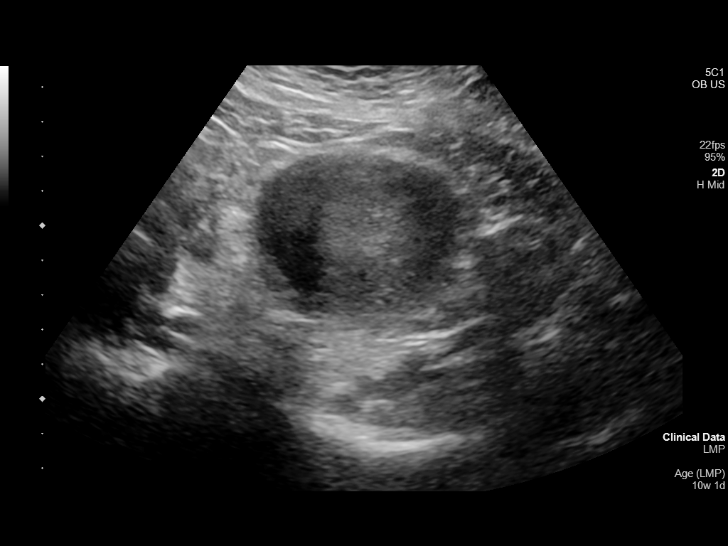
[im 14/118]
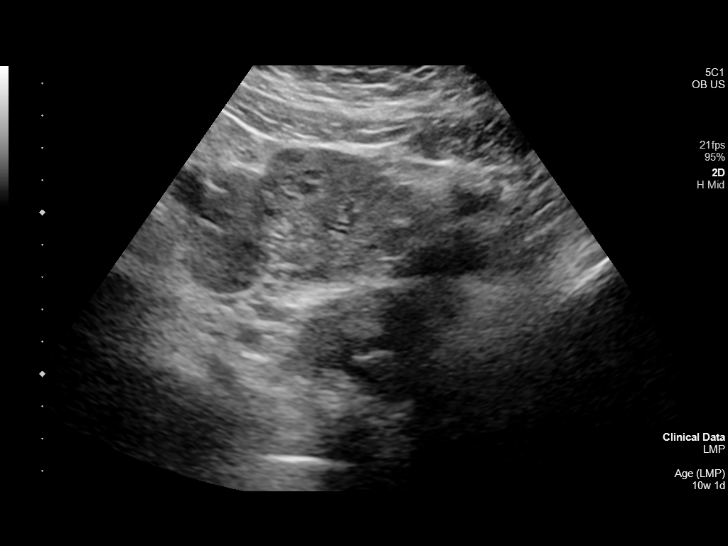
[im 22/118]
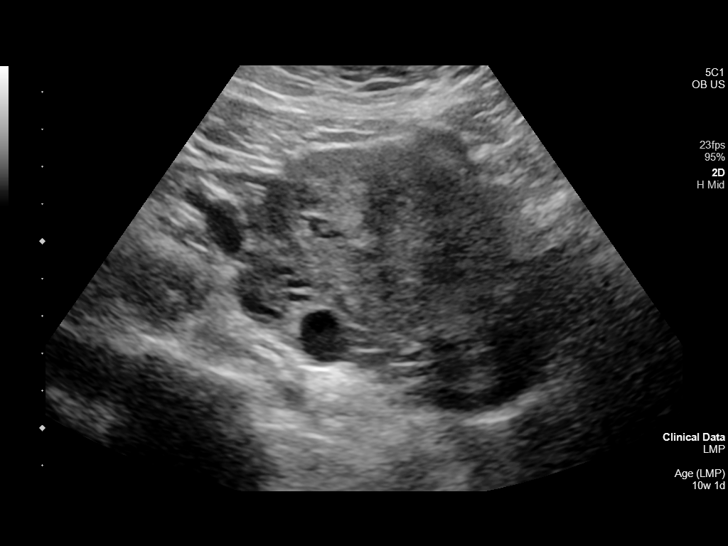
[im 31/118]
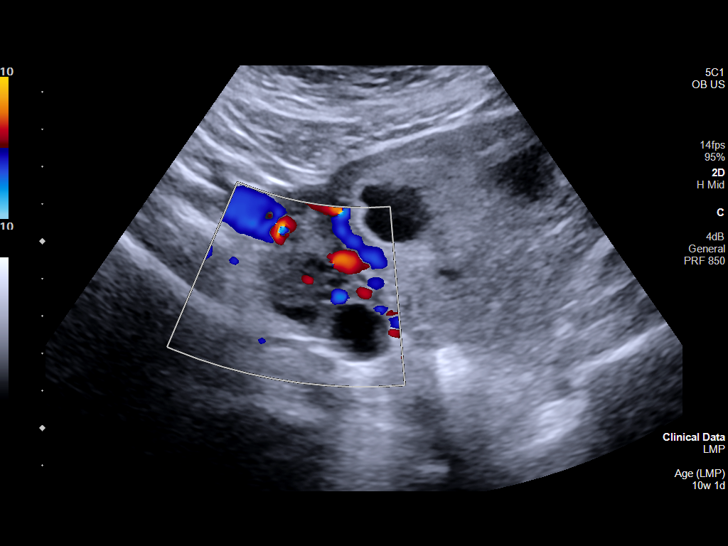
[im 40/118]
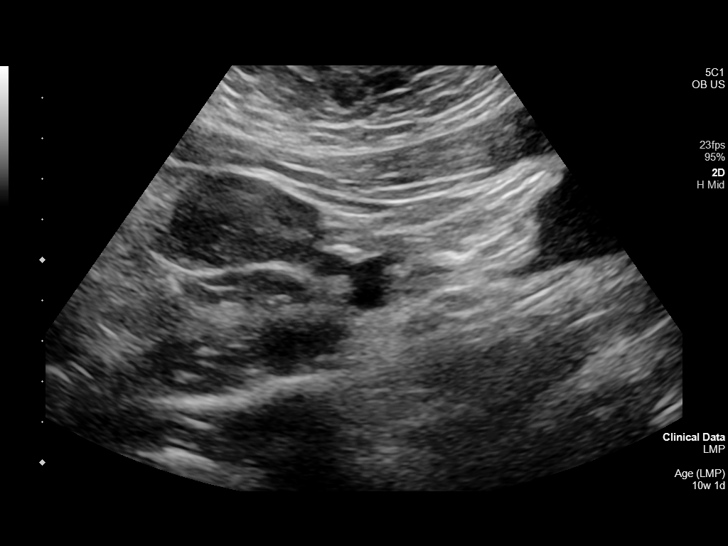
[im 48/118]
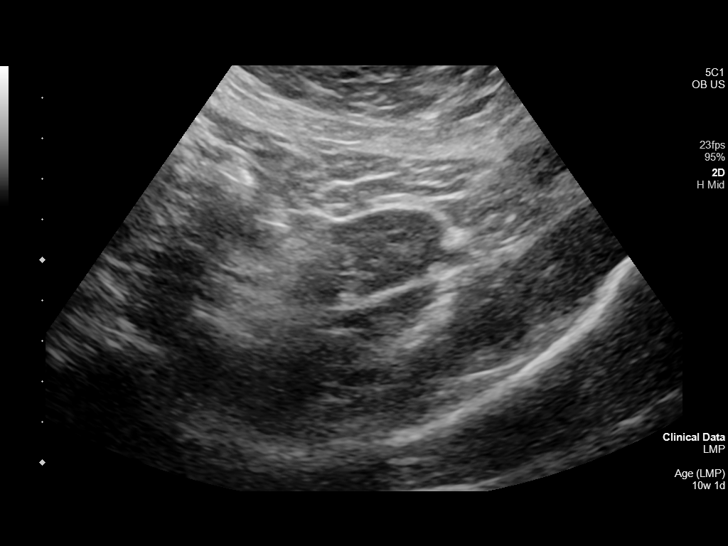
[im 61/118]
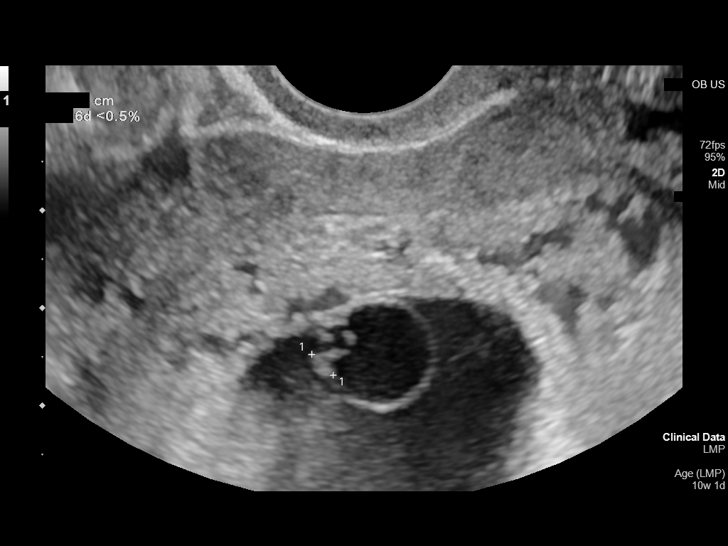
[im 70/118]
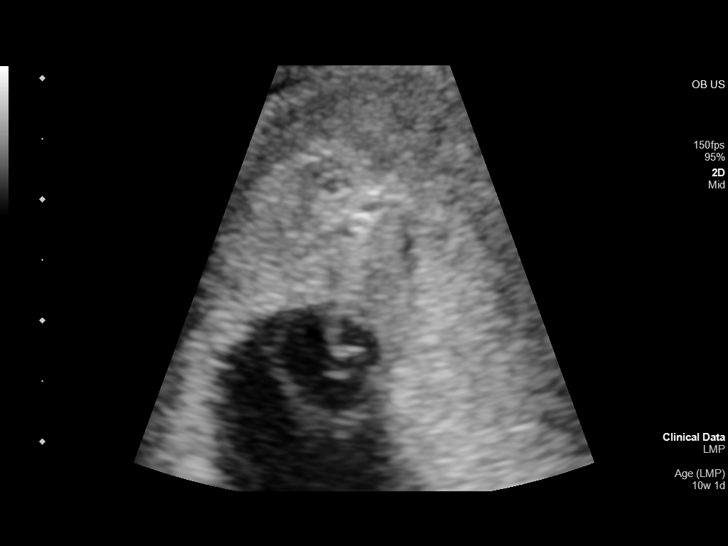
[im 79/118]
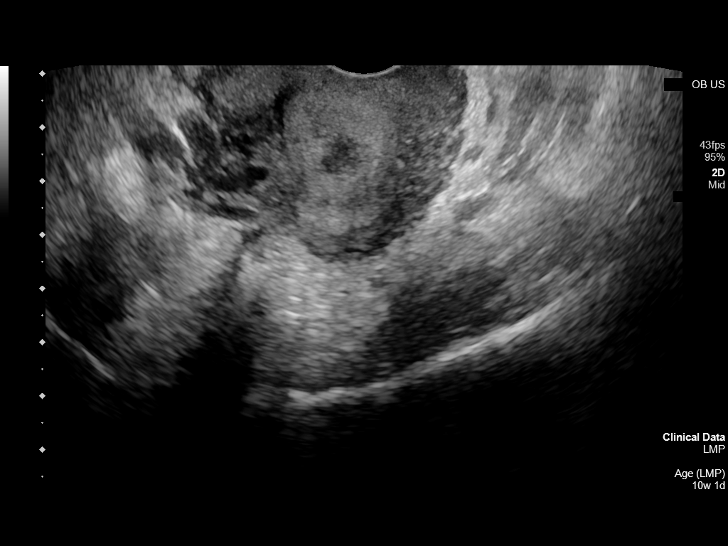
[im 87/118]
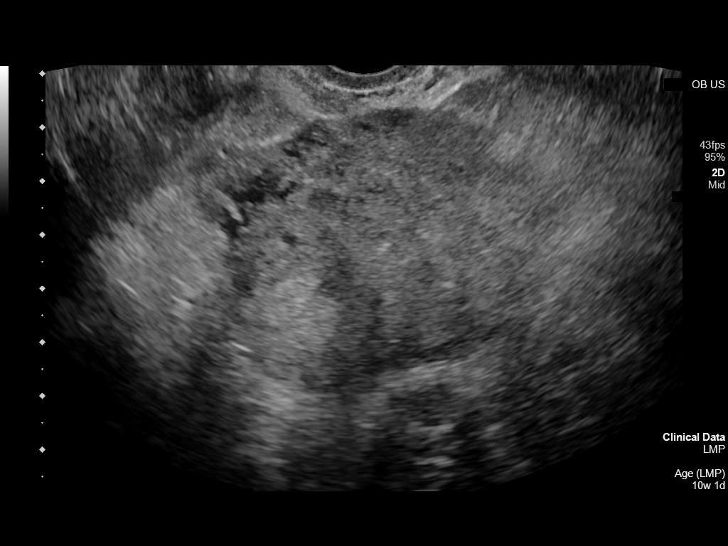
[im 96/118]
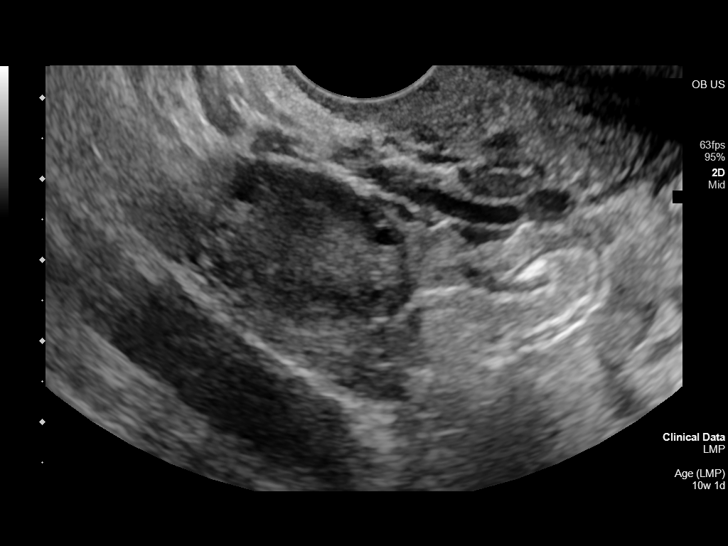
[im 105/118]
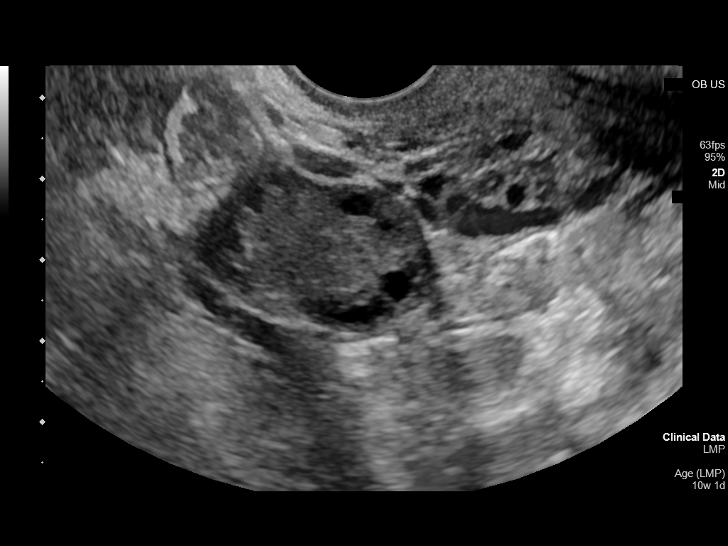
[im 113/118]
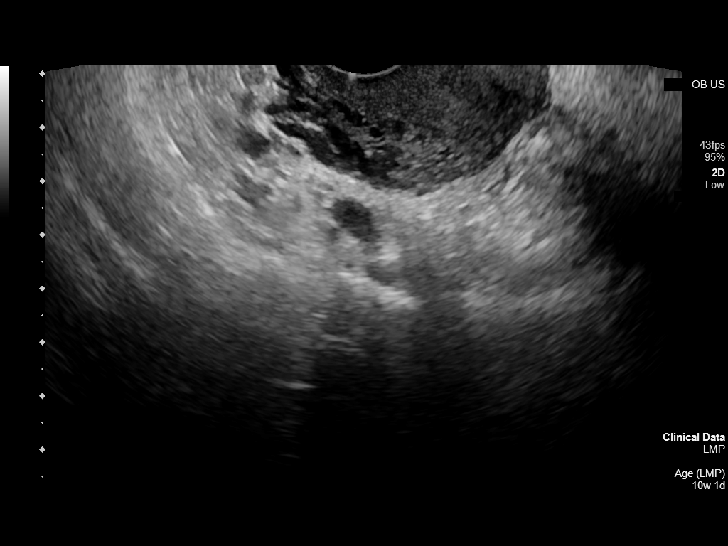

[13 of 28 positions shown; findings below may reference images not displayed]

FINDINGS: Intrauterine gestational sac: Single

Yolk sac:  Visible (image 55)

Embryo:  Questionable (image 61)

Cardiac Activity: Not detected

MSD: 30.8 mm   8 w   2 d

CRL:   3.1 mm   5 w 6 d

Subchorionic hemorrhage:  None visualized.

Maternal uterus/adnexae: No pelvic free fluid.

A 21 mm simple appearing cyst with no Doppler vascular elements is
identified adjacent to the right ovary (image 89). The right ovary
is otherwise normal measuring 3.0 x 1.8 by 2.7 cm (8 mL) with
preserved color and spectral Doppler flow (image 98).

The left ovary measures 3.7 x 2.3 by 4.1 cm (18 mL) and appears
normal with preserved color and spectral Doppler flow (image 45).
IMPRESSION: 1. Single intrauterine gestational sac with yolk sac and
questionable fetal pole visible.
No cardiac activity detected.  Discrepant MSD and presumed CRL.
Findings are suspicious but not yet definitive for failed pregnancy.
Recommend follow-up US in 10-14 days for definitive diagnosis. This
recommendation follows SRU consensus guidelines: Diagnostic Criteria
for Nonviable Pregnancy Early in the First Trimester. N Engl J Med
2. No subchorionic hemorrhage or pelvic free fluid. Simple appearing
2.1 cm right ovarian cyst.

## 2022-07-24 IMAGING — US US OB < 14 WEEKS - US OB TV
1 series · 15 of 28 positions shown · non-contrast
Comparison: 11/21/2021

CLINICAL DATA: Viability

EXAM:
OBSTETRIC <14 WK US AND TRANSVAGINAL OB US
TECHNIQUE: Both transabdominal and transvaginal ultrasound examinations were
performed for complete evaluation of the gestation as well as the
maternal uterus, adnexal regions, and pelvic cul-de-sac.
Transvaginal technique was performed to assess early pregnancy.

[Series 1: us op ob comp less 14 wks · 15 of 113 slices shown]
[im 1/113]
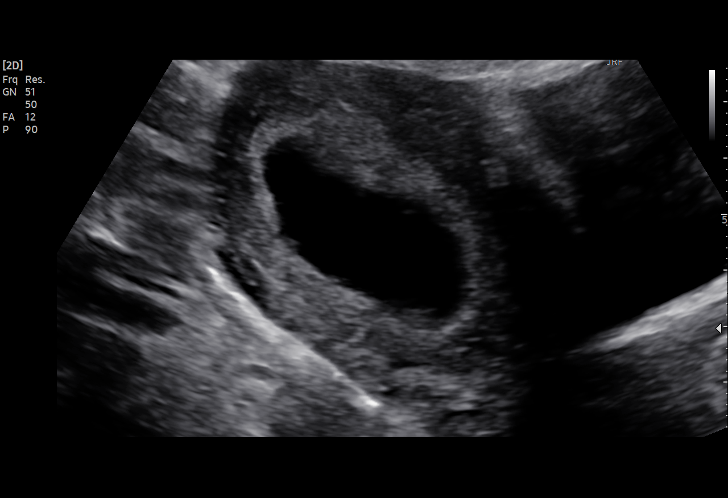
[im 9/113]
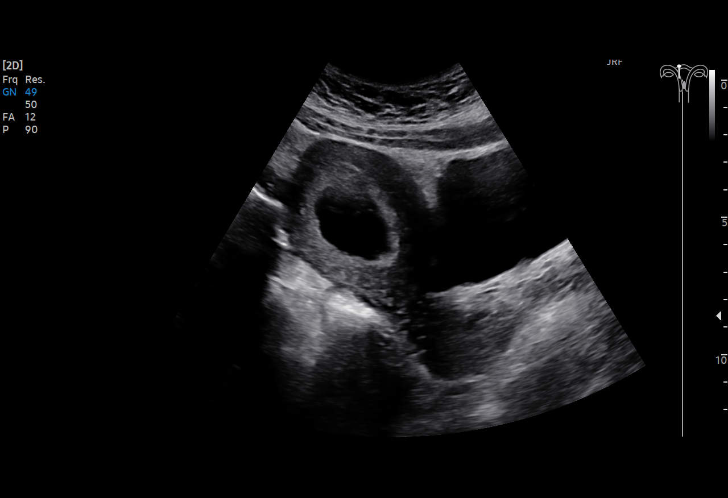
[im 17/113]
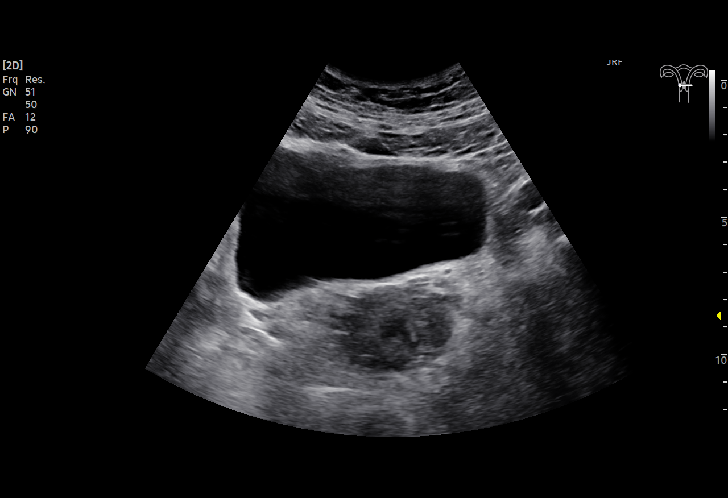
[im 25/113]
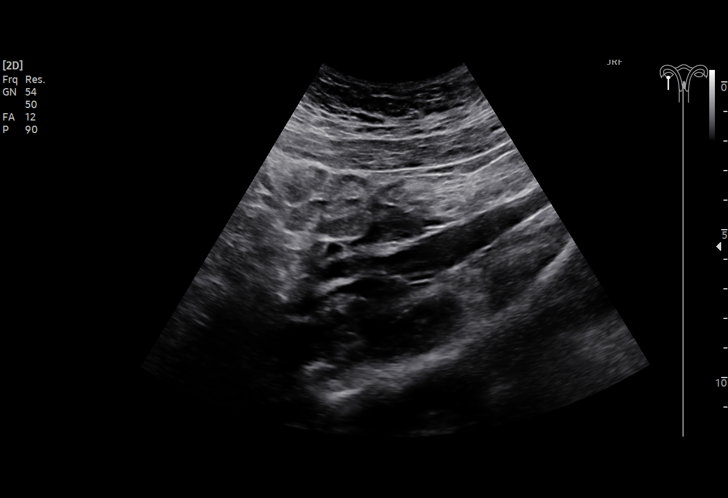
[im 34/113]
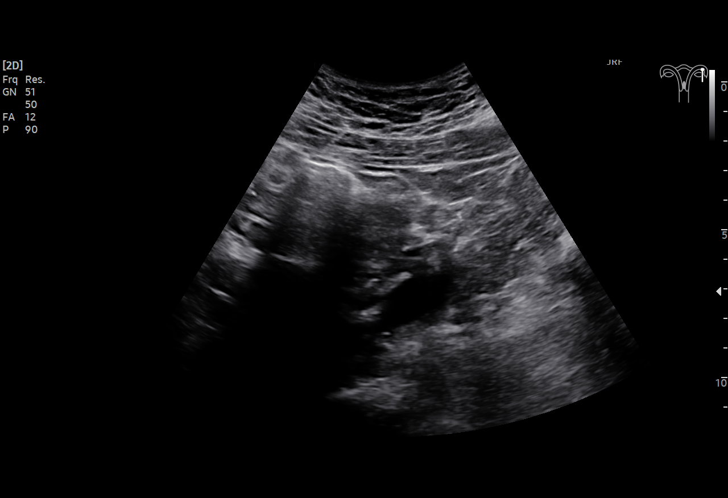
[im 42/113]
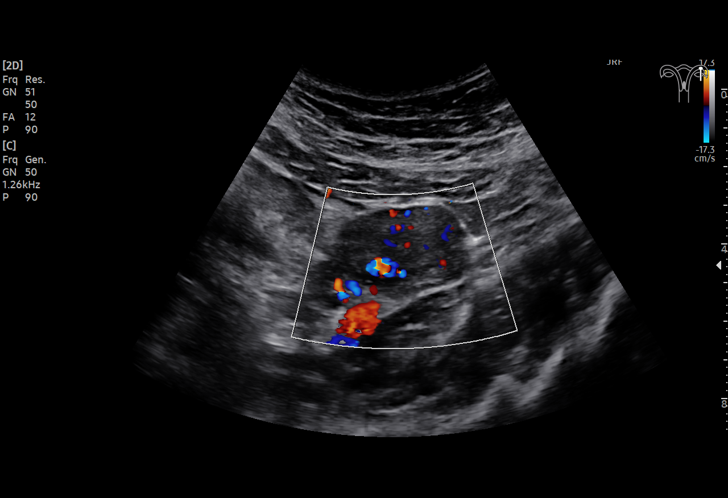
[im 50/113]
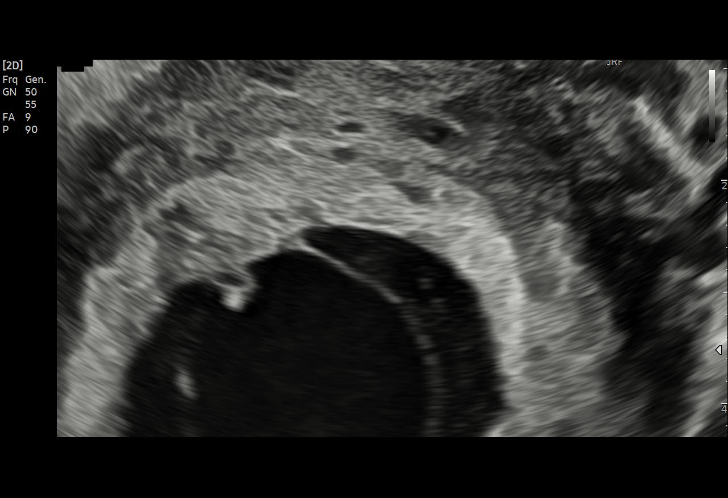
[im 59/113]
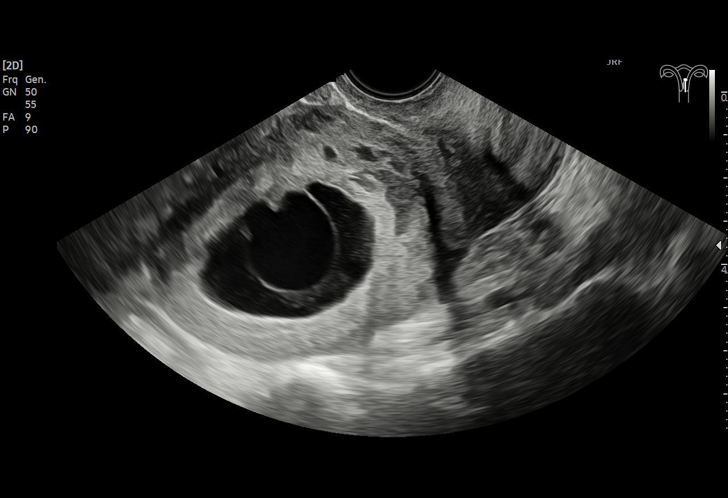
[im 63/113]
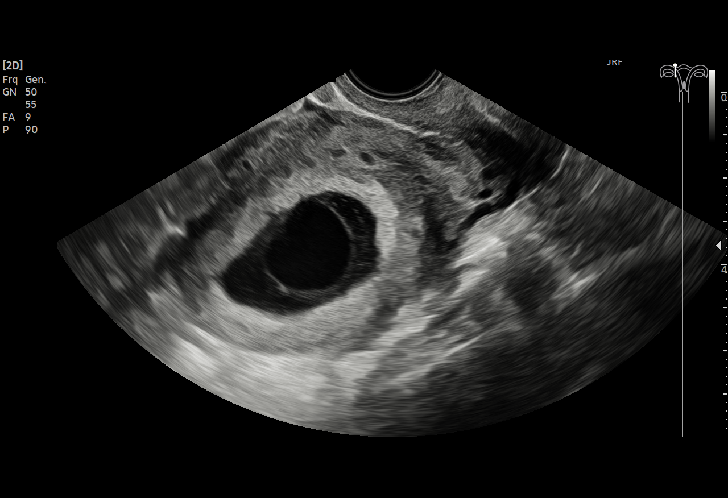
[im 71/113]
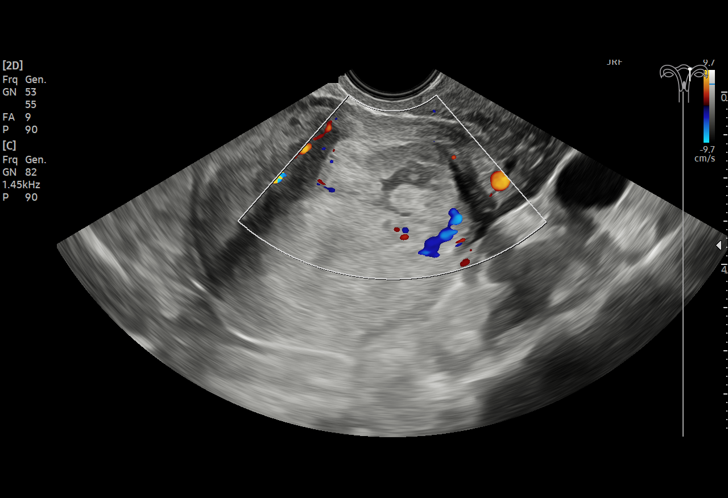
[im 79/113]
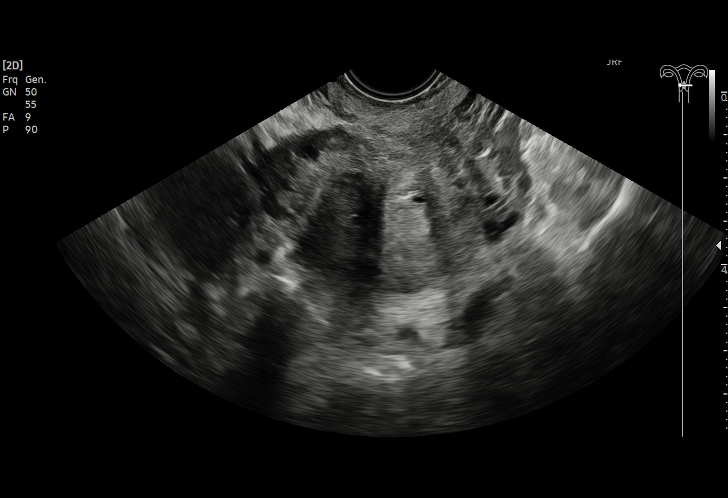
[im 88/113]
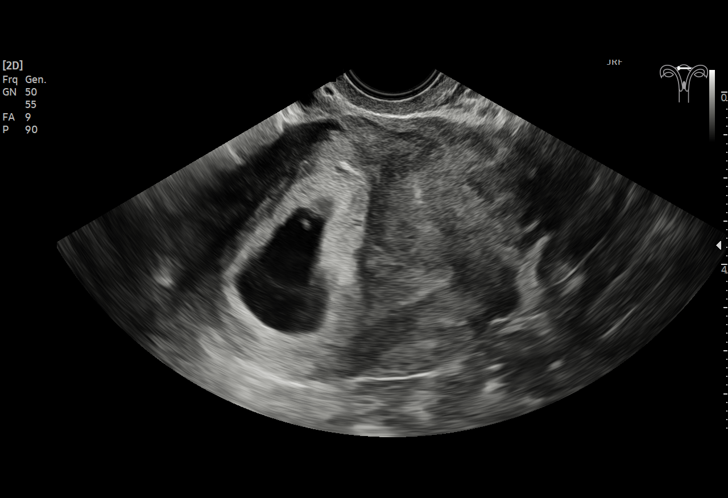
[im 96/113]
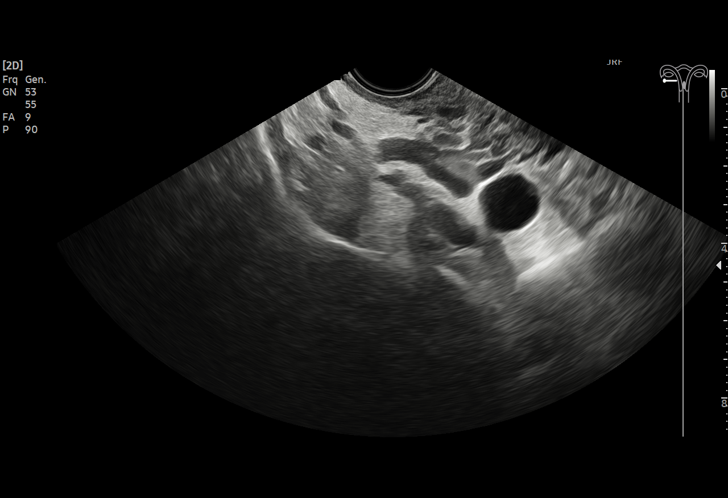
[im 104/113]
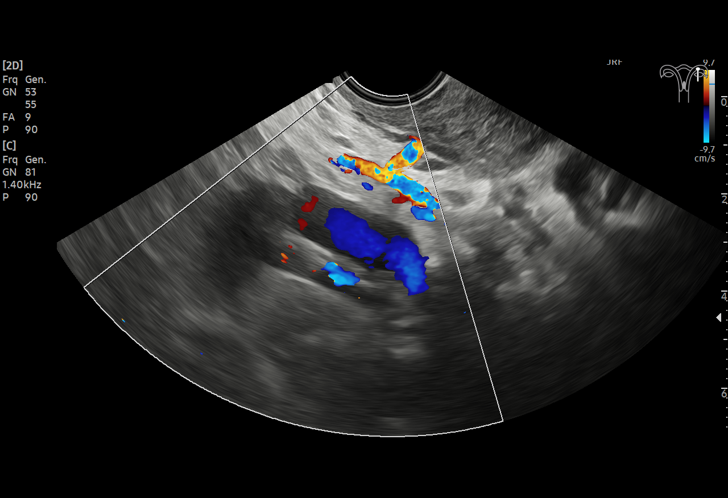
[im 113/113]
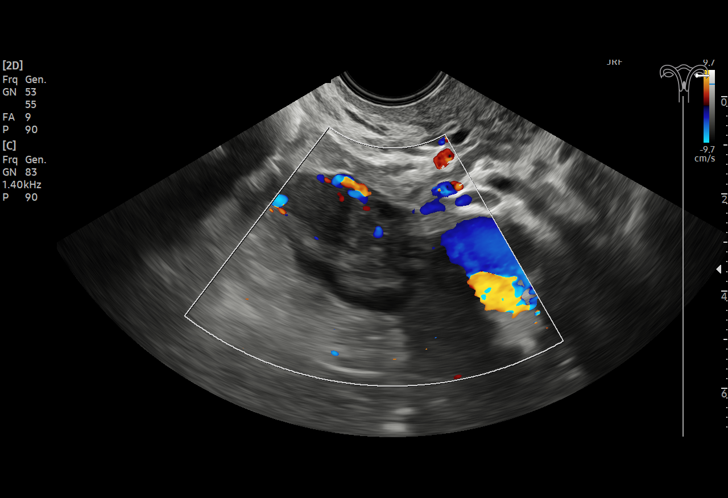

[15 of 28 positions shown; findings below may reference images not displayed]

FINDINGS: Intrauterine gestational sac: Single

Yolk sac:  Visualized

Embryo: Questionable but no interval change in appearance since
previous ultrasound from 11/21/2021.

Cardiac Activity: Not visualized

MSD: 32.6 mm   8 w   3 d

CRL:  3.5 mm   6 w   0 d                  US EDC: 07/29/2022

Subchorionic hemorrhage:  Interval small subchorionic hemorrhage.

Maternal uterus/adnexae: Ovaries appear within normal limits. Right
ovary measures 3.3 x 1.8 x 3 cm. Left ovary measures 3.8 x 2.5 x
cm. No significant free fluid.
IMPRESSION: 1. Single large intrauterine gestational sac with mean sac diameter
of 32.6 mm. Questionable but not definitive tiny fetal pole but no
interval change in appearance since 11/21/2021 and no interval fetal
cardiac activity. Findings meet definitive criteria for failed
pregnancy. This follows SRU consensus guidelines: Diagnostic
Criteria for Nonviable Pregnancy Early in the First Trimester. N
Engl J Med 3790;[DATE].
2. Interval small subchorionic hemorrhage.

These results will be called to the ordering clinician or
representative by the Radiologist Assistant, and communication
documented in the PACS or [REDACTED].
